# Patient Record
Sex: Male | Born: 1985 | Race: White | Hispanic: No | Marital: Single | Smoking: Current every day smoker
Health system: Southern US, Community
[De-identification: ages and names within clinical notes are randomized; demographics above are authoritative.]

## PROBLEM LIST (undated history)

## (undated) DIAGNOSIS — F1020 Alcohol dependence, uncomplicated: Secondary | ICD-10-CM

## (undated) DIAGNOSIS — F17200 Nicotine dependence, unspecified, uncomplicated: Secondary | ICD-10-CM

## (undated) DIAGNOSIS — R748 Abnormal levels of other serum enzymes: Secondary | ICD-10-CM

## (undated) DIAGNOSIS — F1210 Cannabis abuse, uncomplicated: Secondary | ICD-10-CM

## (undated) DIAGNOSIS — I10 Essential (primary) hypertension: Secondary | ICD-10-CM

## (undated) HISTORY — DX: Abnormal levels of other serum enzymes: R74.8

---

## 2011-10-29 ENCOUNTER — Ambulatory Visit: Payer: 59

## 2011-10-29 ENCOUNTER — Ambulatory Visit (INDEPENDENT_AMBULATORY_CARE_PROVIDER_SITE_OTHER): Payer: 59 | Admitting: Family Medicine

## 2011-10-29 VITALS — BP 122/82 | HR 76 | Temp 98.2°F | Resp 16 | Ht 71.0 in | Wt 194.0 lb

## 2011-10-29 DIAGNOSIS — R202 Paresthesia of skin: Secondary | ICD-10-CM

## 2011-10-29 DIAGNOSIS — M79643 Pain in unspecified hand: Secondary | ICD-10-CM

## 2011-10-29 DIAGNOSIS — M549 Dorsalgia, unspecified: Secondary | ICD-10-CM

## 2011-10-29 DIAGNOSIS — R200 Anesthesia of skin: Secondary | ICD-10-CM

## 2011-10-29 DIAGNOSIS — M79609 Pain in unspecified limb: Secondary | ICD-10-CM

## 2011-10-29 NOTE — Progress Notes (Signed)
  [facility]:  Office Visit  Chief Complaint:  Chief Complaint  Patient presents with  . Tailbone Pain    fell going down stairs- 2 days ago  . Hand Pain    Rt pinky pain- 2 days ago    HPI: Stephen Reid is a 26 y.o. male who complains of  2 day h/o back and right 5 th finger  And hand pain s/p fall. He fell on back and slid down stairs while trying to avoid a cat. He has numbness and tingling and pain in pinkey. Swelling. + back pain. ATried ASA wihtout relief. No pprior trauma or back problems. Works as Nutritional therapist, is right hand dominant.   History reviewed. No pertinent past medical history. History reviewed. No pertinent past surgical history. History   Social History  . Marital Status: Single    Spouse Name: N/A    Number of Children: N/A  . Years of Education: N/A   Social History Main Topics  . Smoking status: Current Everyday Smoker -- 0.2 packs/day  . Smokeless tobacco: None  . Alcohol Use: Yes  . Drug Use: No  . Sexually Active: None   Other Topics Concern  . None   Social History Narrative  . None   Family History  Problem Relation Age of Onset  . Diabetes Maternal Grandmother   . Diabetes Maternal Grandfather    No Known Allergies Prior to Admission medications   Not on File     ROS: The patient denies fevers, chills, night sweats, unintentional weight loss, chest pain, palpitations, wheezing, dyspnea on exertion, nausea, vomiting, abdominal pain, dysuria, hematuria, melena, weakness.   All other systems have been reviewed and were otherwise negative with the exception of those mentioned in the HPI and as above.    PHYSICAL EXAM: Filed Vitals:   10/29/11 1106  BP: 122/82  Pulse: 76  Temp: 98.2 F (36.8 C)  Resp: 16   Filed Vitals:   10/29/11 1106  Height: 5\' 11"  (1.803 m)  Weight: 194 lb (87.998 kg)   Body mass index is 27.06 kg/(m^2).  General: Alert, no acute distress HEENT:  Normocephalic, atraumatic, oropharynx  patent.  Cardiovascular:  Regular rate and rhythm, no rubs murmurs or gallops.  No Carotid bruits, radial pulse intact. No pedal edema.  Respiratory: Clear to auscultation bilaterally.  No wheezes, rales, or rhonchi.  No cyanosis, no use of accessory musculature GI: No organomegaly, abdomen is soft and non-tender, positive bowel sounds.  No masses. Skin: No rashes. Neurologic: Facial musculature symmetric. Psychiatric: Patient is appropriate throughout our interaction. Lymphatic: No cervical lymphadenopathy Musculoskeletal: Gait intact. Right 5 th finger-swelling at 5th DIP, tender, pain with ROM, good radial pulses. 4/5 strength, sensaation intact, +2 DTR Back-tender at sacrum and along spinous process of L1-L5   LABS: No results found for this or any previous visit.   EKG/XRAY:   Primary read interpreted by Dr. Conley Rolls at [facility]. Xrays negative for fx or dislocation. + soft swelling right 5th PIP   ASSESSMENT/PLAN: Encounter Diagnoses  Name Primary?  . Hand pain Yes  . Back pain   . Numbness and tingling     No fx/dislocation Buddy tape and/or splint prn NSAID prn pain Work note given, may return to work without restrictions on Monday   Hamilton Capri [place], DO 10/29/2011 2:27 PM

## 2013-04-24 IMAGING — CR DG HAND COMPLETE 3+V*R*
2 series · 2 of 2 positions shown · non-contrast
Comparison: None.

CLINICAL DATA: Fall down stairs.  Hand pain.

RIGHT HAND - COMPLETE 3+ VIEW

[PA]
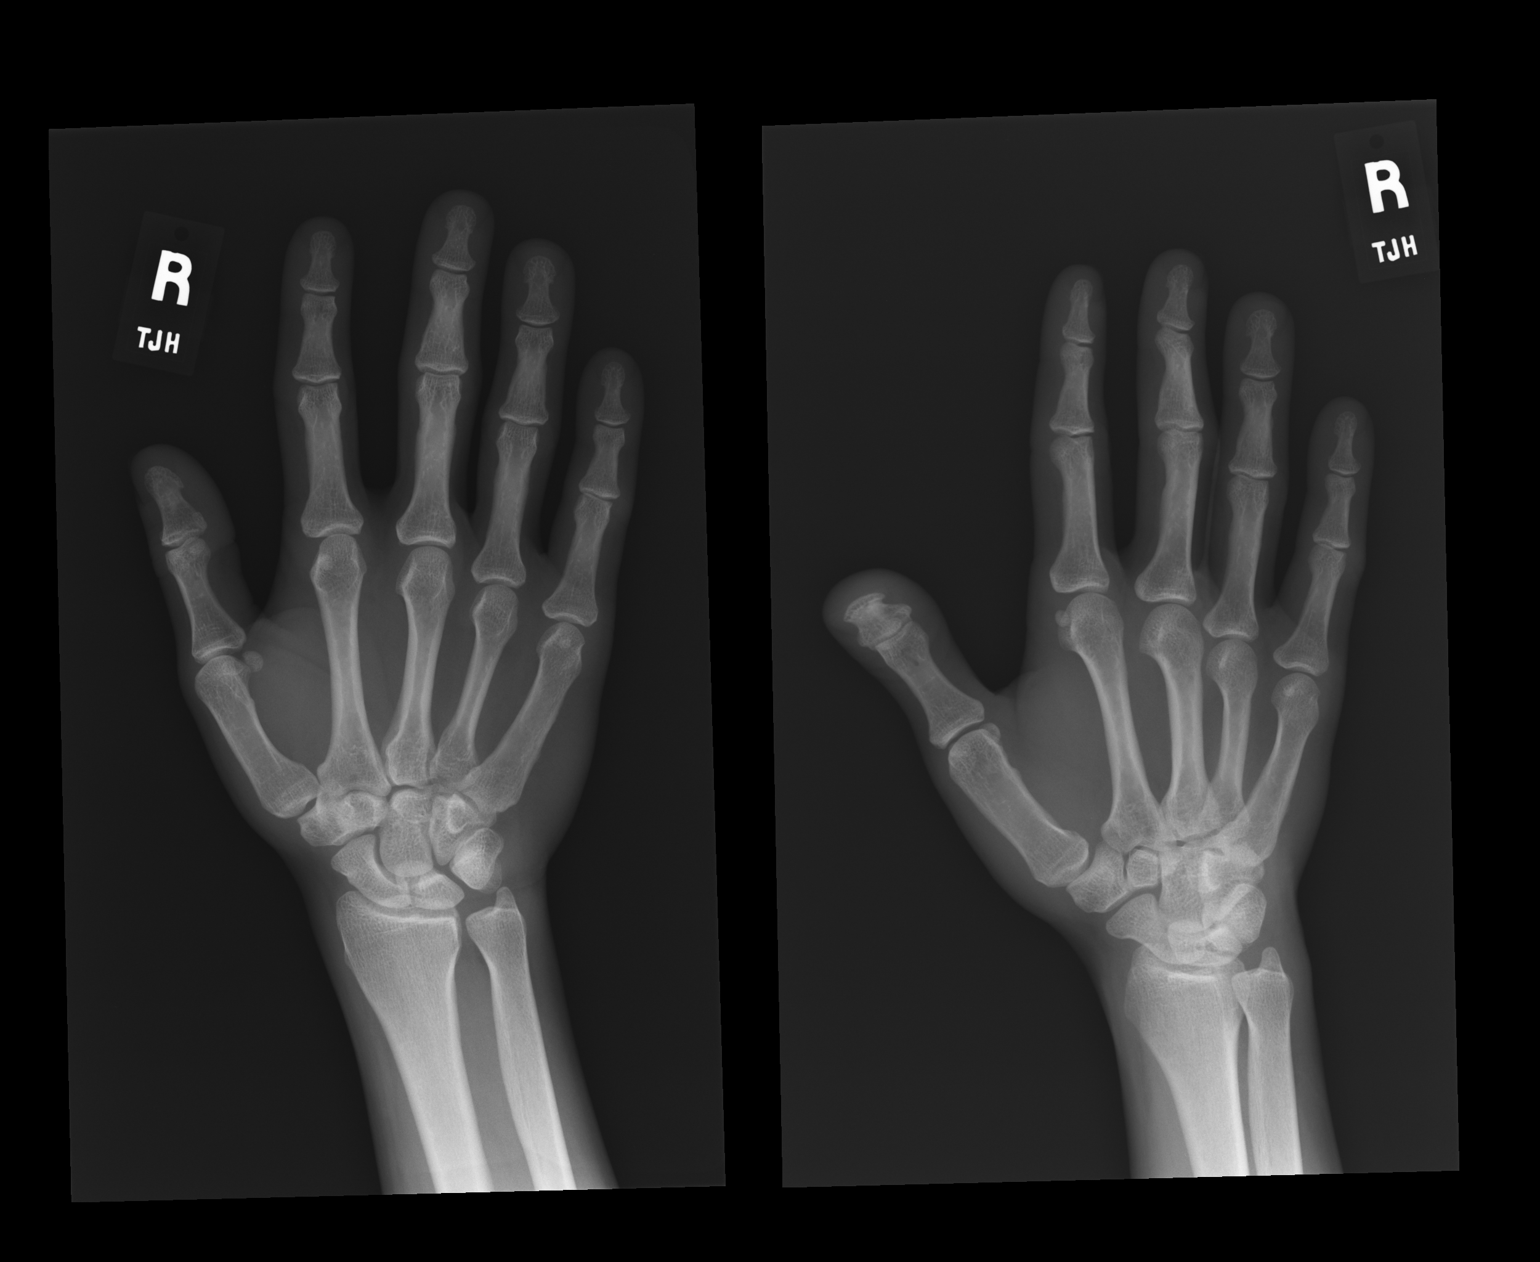

[lateral]
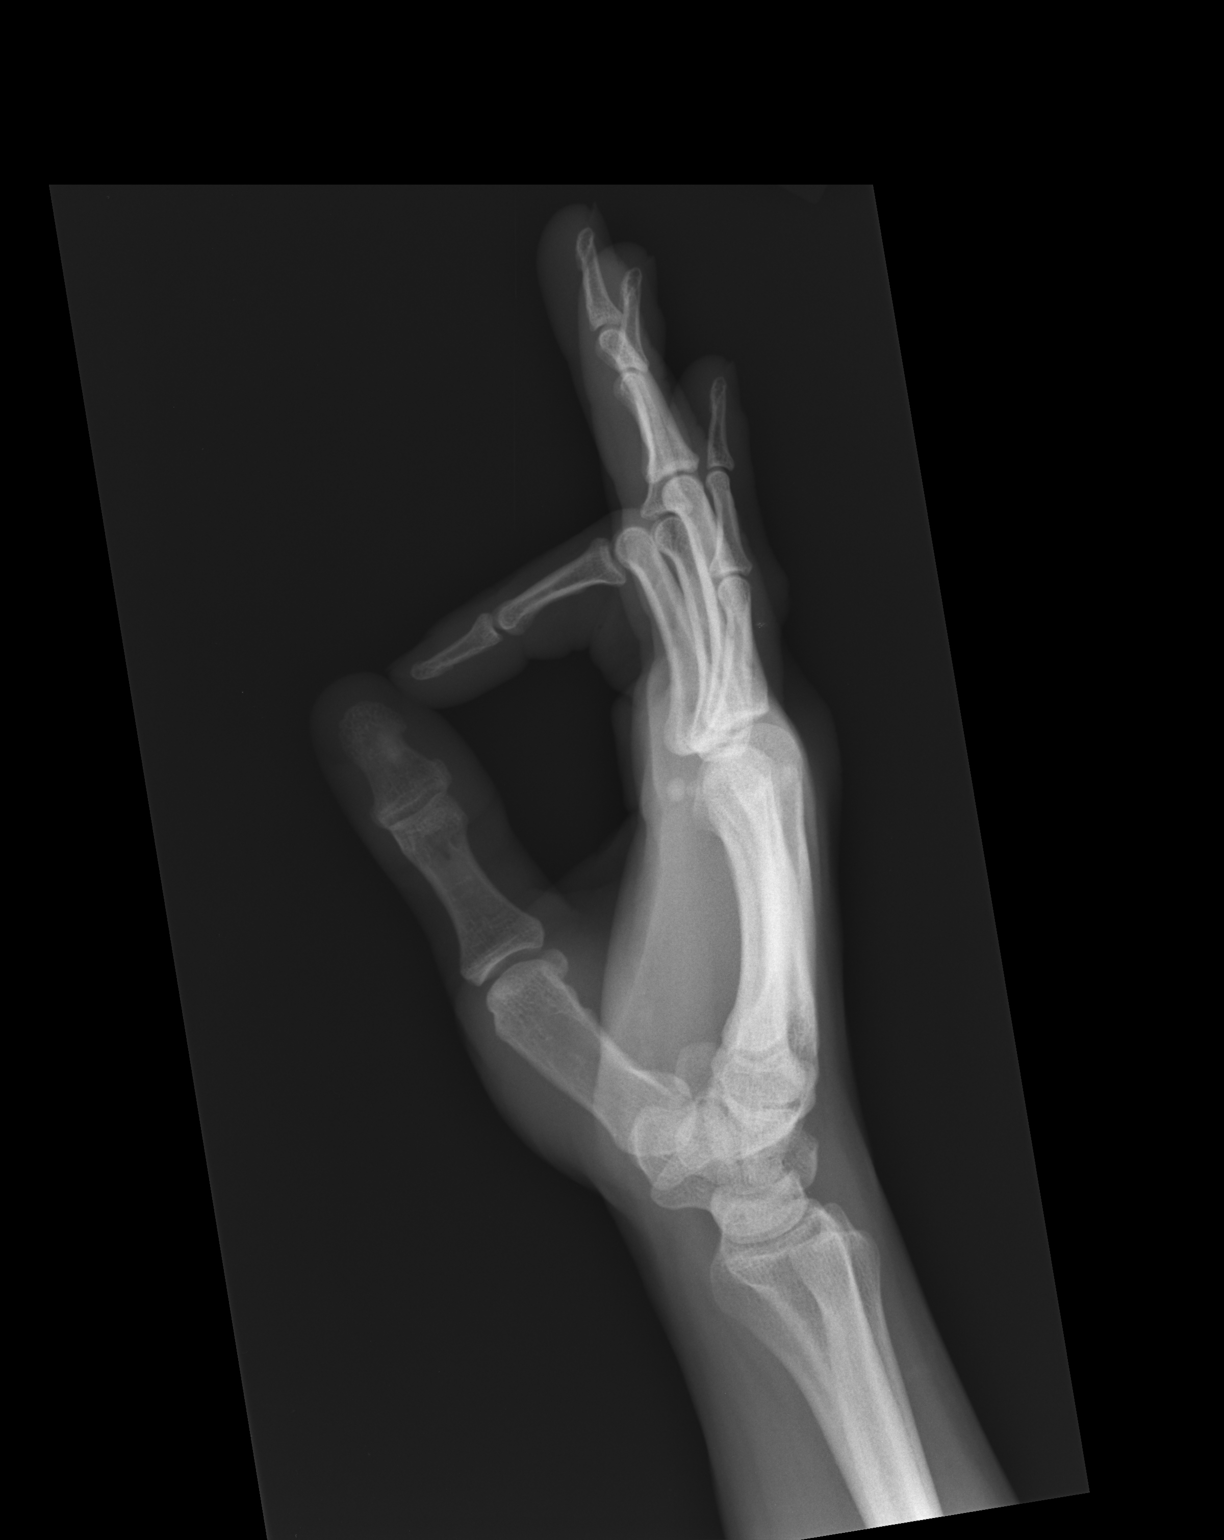

[2 of 2 positions shown; findings below may reference images not displayed]

FINDINGS: Anatomic alignment of the bones of the right hand.  There
is no fracture identified.  Soft tissues appear within normal
limits. Mild soft tissue swelling is present over the PIP joint of
the right small finger.
IMPRESSION: No acute osseous abnormality.

## 2013-04-24 IMAGING — CR DG LUMBAR SPINE COMPLETE 4+V
5 series · 5 of 5 positions shown · non-contrast
Comparison: None.

CLINICAL DATA: Fall.  Back pain.

LUMBAR SPINE - COMPLETE 4+ VIEW

[AP]
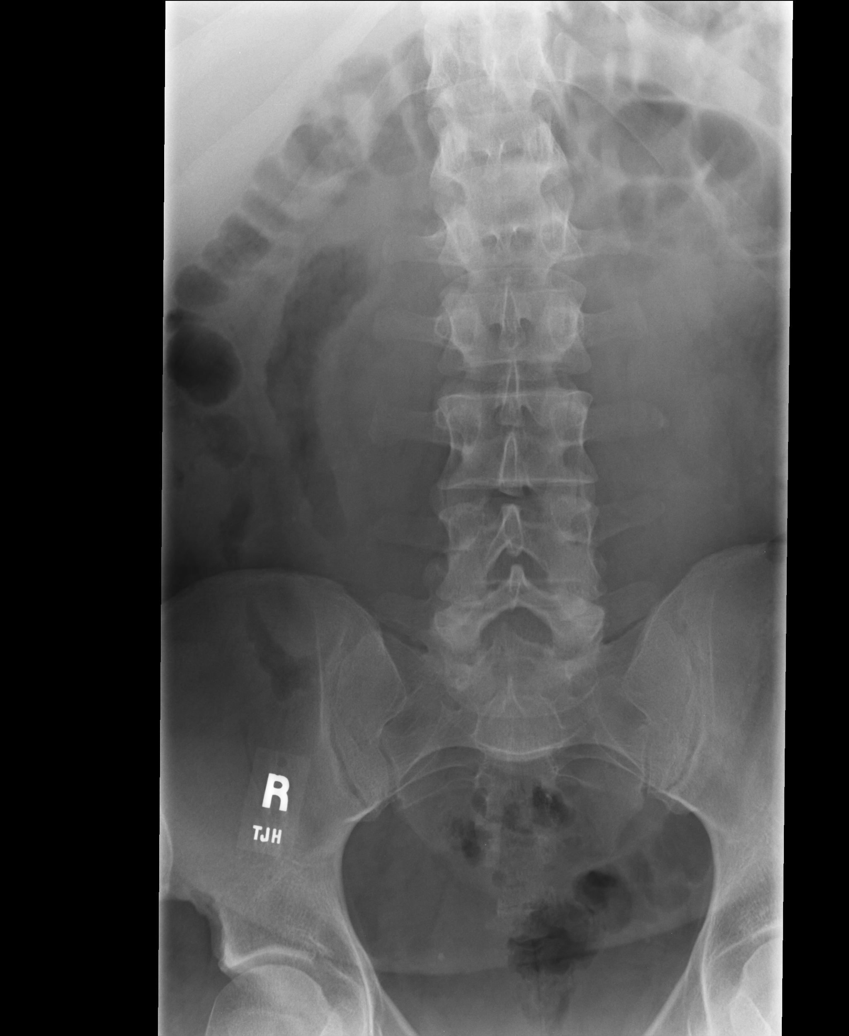

[rpo]
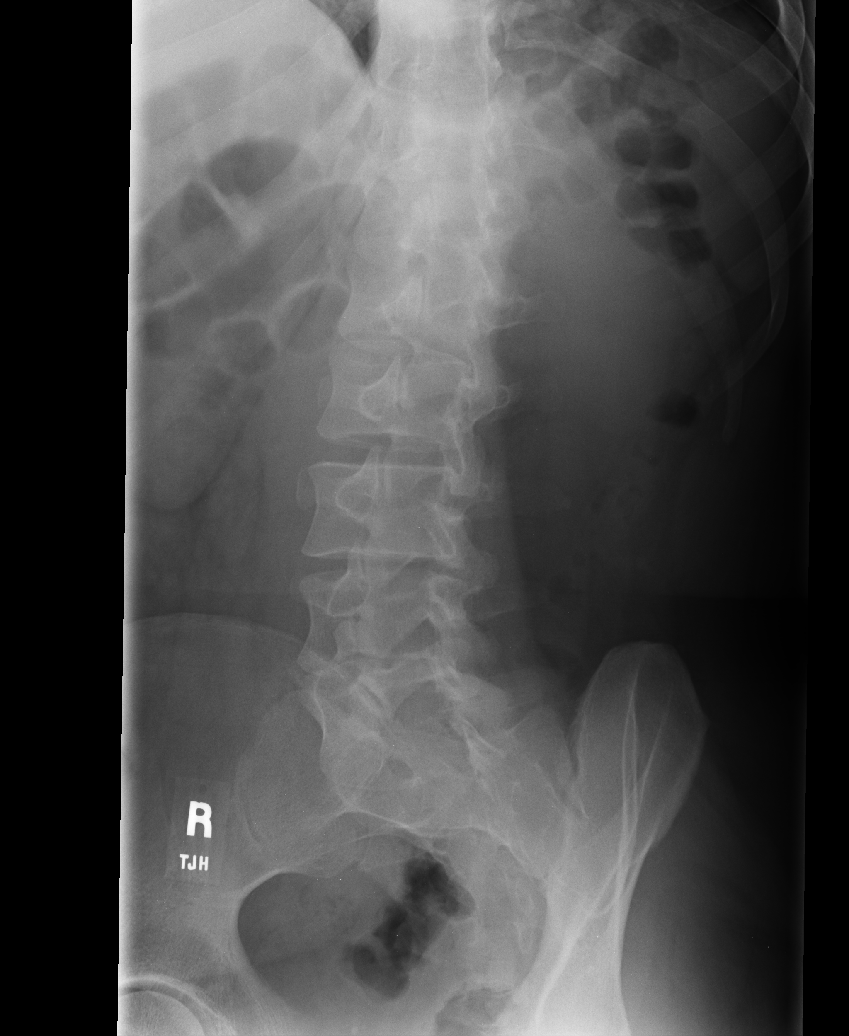

[lpo]
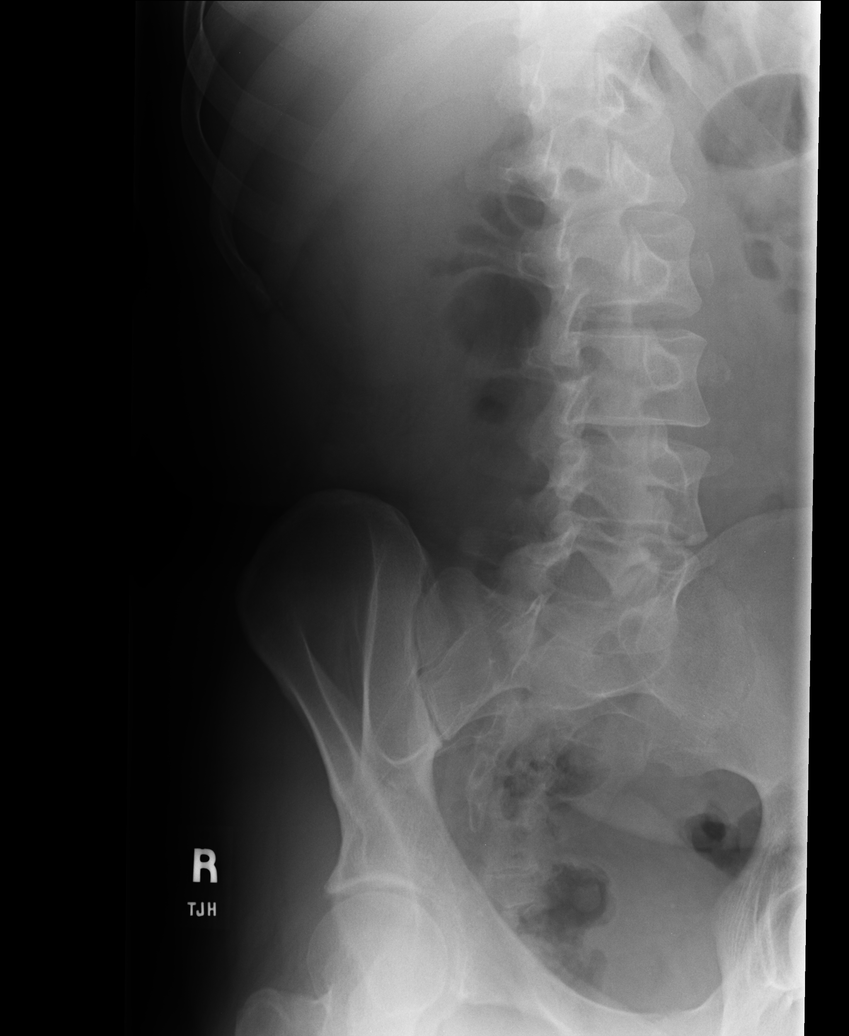

[lateral]
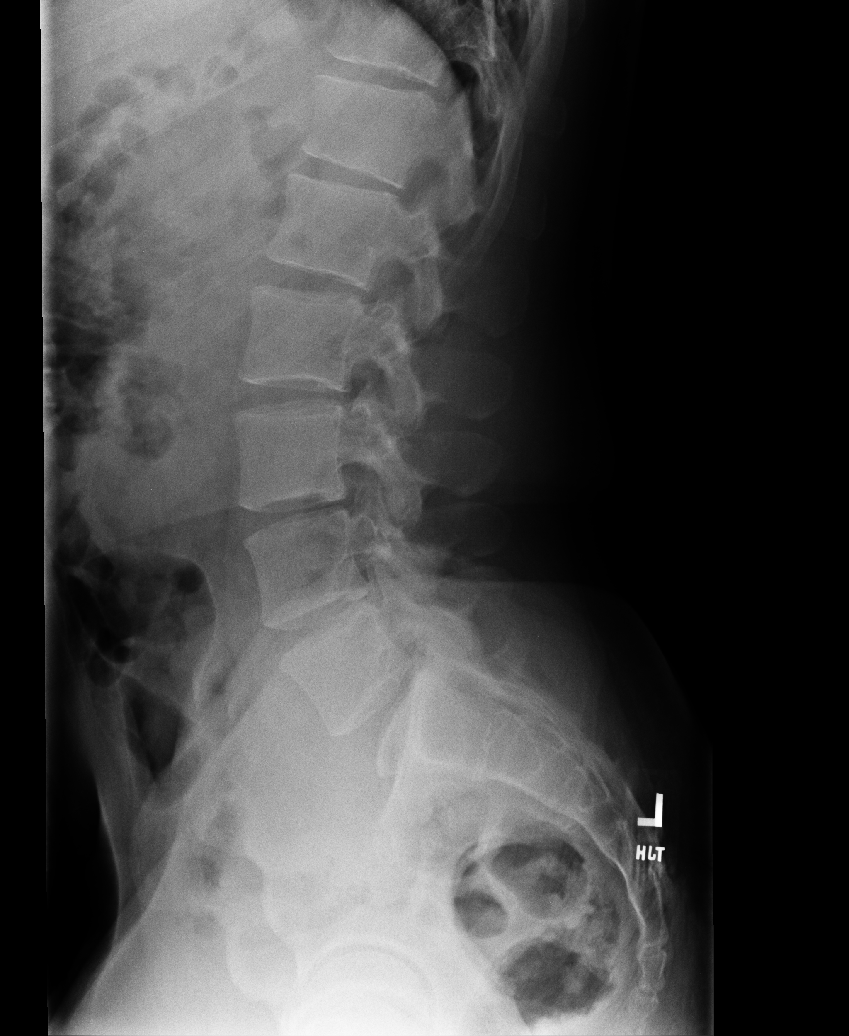

[l5 s1]
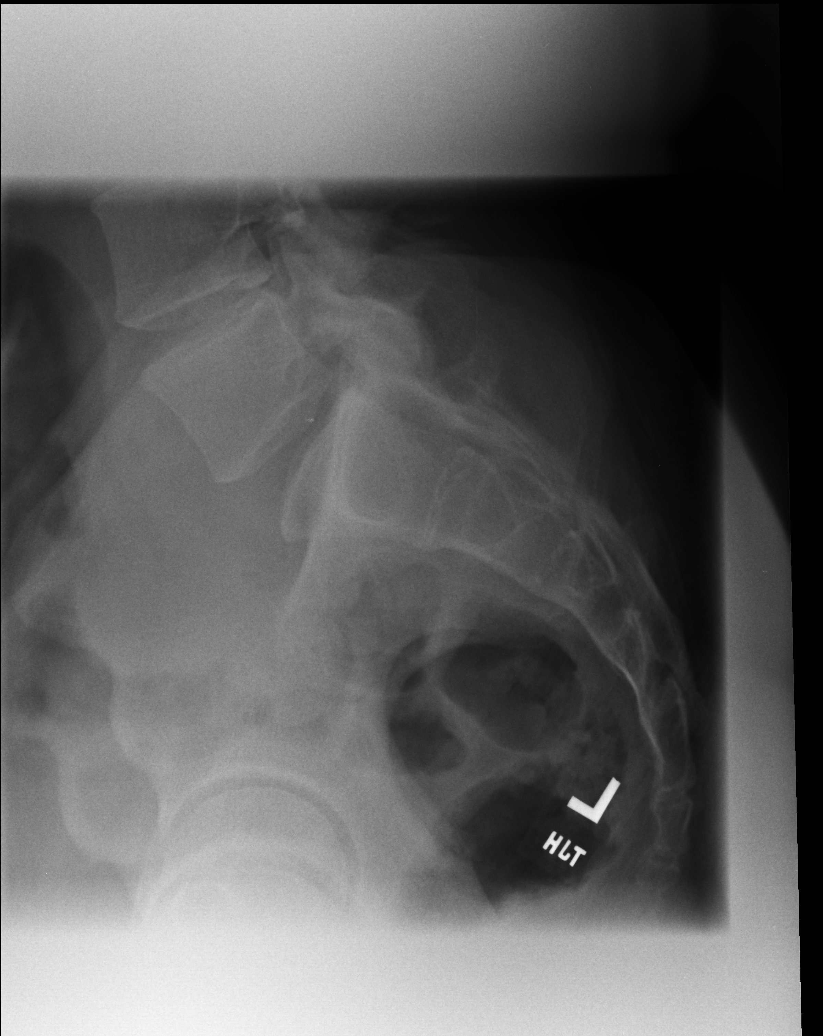

[5 of 5 positions shown; findings below may reference images not displayed]

FINDINGS: Mild levoconvex curvature with the apex at L2-L3.
Vertebral body height is preserved.  There are no pars defects
identified.  L4-L5 mild degenerative disc disease with narrowing of
the disc space.  Slightly exaggerated lumbar lordosis.  No
fracture.  Lumbosacral junction normal.
IMPRESSION: Mild L4-L5 degenerative disc disease.  No fracture or acute osseous
abnormality.  Mild levoconvex curvature may be positional or
secondary to spasm.

## 2013-04-24 IMAGING — CR DG THORACIC SPINE 2V
2 series · 2 of 2 positions shown · non-contrast
Comparison: None.

CLINICAL DATA: Fall.  Back pain.

THORACIC SPINE - 2 VIEW

[AP]
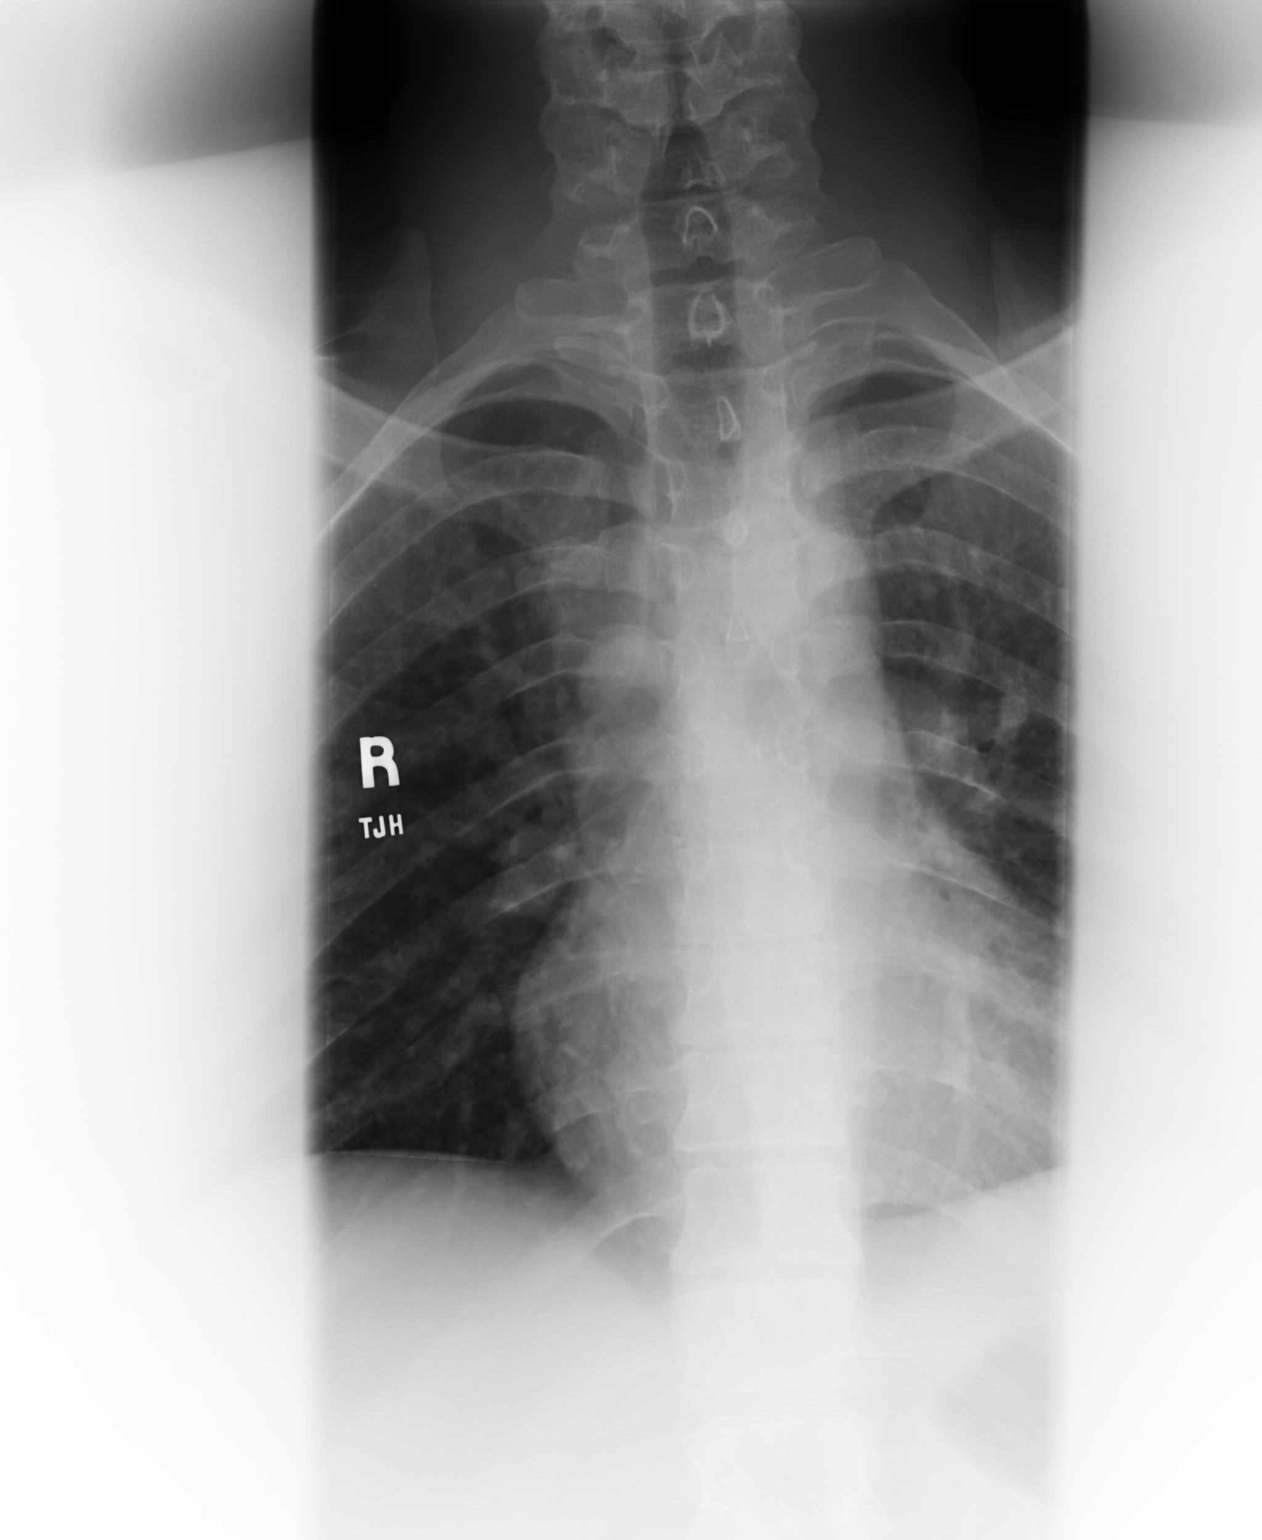

[lateral]
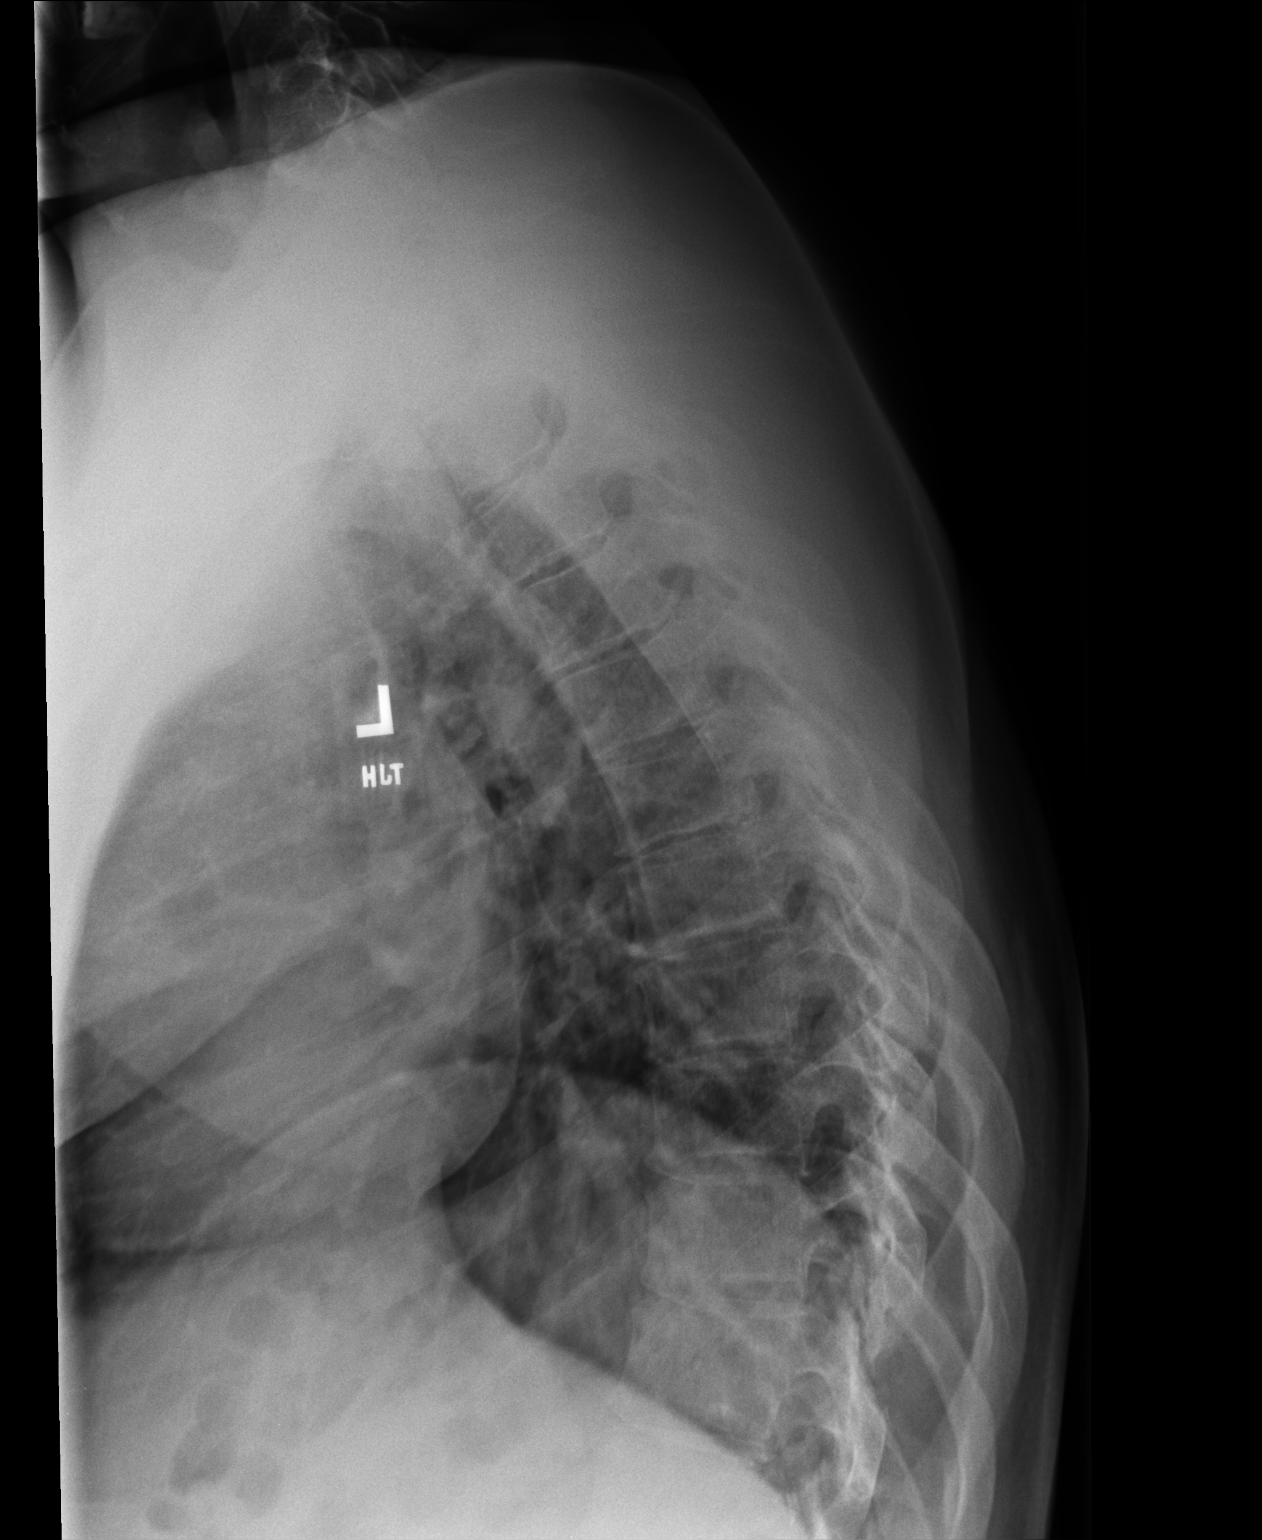

[2 of 2 positions shown; findings below may reference images not displayed]

FINDINGS: Anatomic alignment.  Vertebral body height is preserved.
Lower thoracic spine seen in conjunction with lumbar spine
radiographs same day.  Cervicothoracic junction not visualized.  No
swimmer's view is submitted.
IMPRESSION: No acute osseous abnormality.

## 2019-03-15 ENCOUNTER — Other Ambulatory Visit: Payer: Self-pay

## 2019-03-15 DIAGNOSIS — Z20822 Contact with and (suspected) exposure to covid-19: Secondary | ICD-10-CM

## 2019-03-17 LAB — NOVEL CORONAVIRUS, NAA: SARS-CoV-2, NAA: NOT DETECTED

## 2020-03-05 DIAGNOSIS — M79672 Pain in left foot: Secondary | ICD-10-CM | POA: Insufficient documentation

## 2020-03-05 DIAGNOSIS — G8929 Other chronic pain: Secondary | ICD-10-CM | POA: Insufficient documentation

## 2020-03-15 DIAGNOSIS — L03116 Cellulitis of left lower limb: Secondary | ICD-10-CM | POA: Insufficient documentation

## 2022-08-08 ENCOUNTER — Other Ambulatory Visit: Payer: Self-pay

## 2022-08-08 ENCOUNTER — Inpatient Hospital Stay (HOSPITAL_BASED_OUTPATIENT_CLINIC_OR_DEPARTMENT_OTHER)
Admission: EM | Admit: 2022-08-08 | Discharge: 2022-08-12 | DRG: 391 | Disposition: A | Discharge status: Home / Self Care | Payer: 59 | Attending: Internal Medicine | Admitting: Internal Medicine

## 2022-08-08 ENCOUNTER — Encounter (HOSPITAL_BASED_OUTPATIENT_CLINIC_OR_DEPARTMENT_OTHER): Payer: Self-pay

## 2022-08-08 ENCOUNTER — Emergency Department (HOSPITAL_BASED_OUTPATIENT_CLINIC_OR_DEPARTMENT_OTHER): Payer: 59

## 2022-08-08 DIAGNOSIS — F1020 Alcohol dependence, uncomplicated: Secondary | ICD-10-CM

## 2022-08-08 DIAGNOSIS — A419 Sepsis, unspecified organism: Principal | ICD-10-CM

## 2022-08-08 DIAGNOSIS — J690 Pneumonitis due to inhalation of food and vomit: Secondary | ICD-10-CM | POA: Diagnosis present

## 2022-08-08 DIAGNOSIS — E860 Dehydration: Secondary | ICD-10-CM | POA: Diagnosis present

## 2022-08-08 DIAGNOSIS — I10 Essential (primary) hypertension: Secondary | ICD-10-CM | POA: Diagnosis present

## 2022-08-08 DIAGNOSIS — R109 Unspecified abdominal pain: Secondary | ICD-10-CM | POA: Diagnosis not present

## 2022-08-08 DIAGNOSIS — Z1152 Encounter for screening for COVID-19: Secondary | ICD-10-CM

## 2022-08-08 DIAGNOSIS — F1210 Cannabis abuse, uncomplicated: Secondary | ICD-10-CM

## 2022-08-08 DIAGNOSIS — Z79899 Other long term (current) drug therapy: Secondary | ICD-10-CM

## 2022-08-08 DIAGNOSIS — K760 Fatty (change of) liver, not elsewhere classified: Secondary | ICD-10-CM | POA: Diagnosis present

## 2022-08-08 DIAGNOSIS — E876 Hypokalemia: Secondary | ICD-10-CM | POA: Diagnosis present

## 2022-08-08 DIAGNOSIS — Z811 Family history of alcohol abuse and dependence: Secondary | ICD-10-CM

## 2022-08-08 DIAGNOSIS — F17210 Nicotine dependence, cigarettes, uncomplicated: Secondary | ICD-10-CM | POA: Diagnosis present

## 2022-08-08 DIAGNOSIS — E872 Acidosis, unspecified: Secondary | ICD-10-CM | POA: Diagnosis present

## 2022-08-08 DIAGNOSIS — K7010 Alcoholic hepatitis without ascites: Secondary | ICD-10-CM | POA: Diagnosis present

## 2022-08-08 DIAGNOSIS — F17200 Nicotine dependence, unspecified, uncomplicated: Secondary | ICD-10-CM

## 2022-08-08 DIAGNOSIS — K2920 Alcoholic gastritis without bleeding: Principal | ICD-10-CM | POA: Diagnosis present

## 2022-08-08 DIAGNOSIS — F10239 Alcohol dependence with withdrawal, unspecified: Secondary | ICD-10-CM | POA: Diagnosis present

## 2022-08-08 DIAGNOSIS — Z833 Family history of diabetes mellitus: Secondary | ICD-10-CM

## 2022-08-08 HISTORY — DX: Nicotine dependence, unspecified, uncomplicated: F17.200

## 2022-08-08 HISTORY — DX: Cannabis abuse, uncomplicated: F12.10

## 2022-08-08 HISTORY — DX: Alcohol dependence, uncomplicated: F10.20

## 2022-08-08 LAB — CBC
HCT: 51.5 % (ref 39.0–52.0)
Hemoglobin: 18 g/dL — ABNORMAL HIGH (ref 13.0–17.0)
MCH: 34.3 pg — ABNORMAL HIGH (ref 26.0–34.0)
MCHC: 35 g/dL (ref 30.0–36.0)
MCV: 98.1 fL (ref 80.0–100.0)
Platelets: 226 10*3/uL (ref 150–400)
RBC: 5.25 MIL/uL (ref 4.22–5.81)
RDW: 12.7 % (ref 11.5–15.5)
WBC: 21.7 10*3/uL — ABNORMAL HIGH (ref 4.0–10.5)
nRBC: 0 % (ref 0.0–0.2)

## 2022-08-08 LAB — RESP PANEL BY RT-PCR (RSV, FLU A&B, COVID)  RVPGX2
Influenza A by PCR: NEGATIVE
Influenza B by PCR: NEGATIVE
Resp Syncytial Virus by PCR: NEGATIVE
SARS Coronavirus 2 by RT PCR: NEGATIVE

## 2022-08-08 LAB — URINALYSIS, ROUTINE W REFLEX MICROSCOPIC
Bacteria, UA: NONE SEEN
Bilirubin Urine: NEGATIVE
Glucose, UA: NEGATIVE mg/dL
Hgb urine dipstick: NEGATIVE
Ketones, ur: NEGATIVE mg/dL
Leukocytes,Ua: NEGATIVE
Nitrite: NEGATIVE
Specific Gravity, Urine: >1.046 — ABNORMAL HIGH (ref 1.005–1.030)
pH: 6.5 (ref 5.0–8.0)

## 2022-08-08 LAB — COMPREHENSIVE METABOLIC PANEL
ALT: 88 U/L — ABNORMAL HIGH (ref 0–44)
AST: 106 U/L — ABNORMAL HIGH (ref 15–41)
Albumin: 4.2 g/dL (ref 3.5–5.0)
Alkaline Phosphatase: 99 U/L (ref 38–126)
Anion gap: 15 (ref 5–15)
BUN: 8 mg/dL (ref 6–20)
CO2: 23 mmol/L (ref 22–32)
Calcium: 9.5 mg/dL (ref 8.9–10.3)
Chloride: 102 mmol/L (ref 98–111)
Creatinine, Ser: 0.83 mg/dL (ref 0.61–1.24)
GFR, Estimated: >60 mL/min (ref >60)
Glucose, Bld: 123 mg/dL — ABNORMAL HIGH (ref 70–99)
Potassium: 3.9 mmol/L (ref 3.5–5.1)
Sodium: 140 mmol/L (ref 135–145)
Total Bilirubin: 0.9 mg/dL (ref 0.3–1.2)
Total Protein: 7.9 g/dL (ref 6.5–8.1)

## 2022-08-08 LAB — LACTIC ACID, PLASMA
Lactic Acid, Venous: 2.5 mmol/L (ref 0.5–1.9)
Lactic Acid, Venous: 1.3 mmol/L (ref 0.5–1.9)

## 2022-08-08 LAB — LIPASE, BLOOD: Lipase: 30 U/L (ref 11–51)

## 2022-08-08 LAB — TROPONIN I (HIGH SENSITIVITY): Troponin I (High Sensitivity): 4 ng/L (ref <18)

## 2022-08-08 MED ORDER — SODIUM CHLORIDE 0.9 % IV BOLUS (SEPSIS)
1000.0000 mL | Freq: Once | INTRAVENOUS | Status: AC
  Administered 2022-08-08: 1000 mL via INTRAVENOUS

## 2022-08-08 MED ORDER — VANCOMYCIN HCL IN DEXTROSE 1-5 GM/200ML-% IV SOLN
1000.0000 mg | Freq: Once | INTRAVENOUS | Status: AC
  Administered 2022-08-08: 1000 mg via INTRAVENOUS
  Filled 2022-08-08: qty 200

## 2022-08-08 MED ORDER — HYDROMORPHONE HCL 1 MG/ML IJ SOLN
1.0000 mg | Freq: Once | INTRAMUSCULAR | Status: AC
  Administered 2022-08-08: 1 mg via INTRAVENOUS
  Filled 2022-08-08: qty 1

## 2022-08-08 MED ORDER — THIAMINE HCL 100 MG/ML IJ SOLN
100.0000 mg | Freq: Every day | INTRAMUSCULAR | Status: DC
  Administered 2022-08-10: 100 mg via INTRAVENOUS
  Filled 2022-08-08 (×2): qty 2

## 2022-08-08 MED ORDER — DIPHENHYDRAMINE HCL 50 MG/ML IJ SOLN
25.0000 mg | Freq: Once | INTRAMUSCULAR | Status: AC
  Administered 2022-08-08: 25 mg via INTRAVENOUS
  Filled 2022-08-08: qty 1

## 2022-08-08 MED ORDER — LORAZEPAM 2 MG/ML IJ SOLN
0.0000 mg | Freq: Four times a day (QID) | INTRAMUSCULAR | Status: DC
  Administered 2022-08-08 – 2022-08-09 (×2): 2 mg via INTRAVENOUS
  Filled 2022-08-08 (×2): qty 1

## 2022-08-08 MED ORDER — LORAZEPAM 2 MG/ML IJ SOLN
2.0000 mg | Freq: Once | INTRAMUSCULAR | Status: AC
  Administered 2022-08-08: 2 mg via INTRAVENOUS
  Filled 2022-08-08: qty 1

## 2022-08-08 MED ORDER — LACTATED RINGERS IV SOLN: INTRAVENOUS | Status: DC

## 2022-08-08 MED ORDER — SODIUM CHLORIDE 0.9 % IV SOLN
2.0000 g | Freq: Once | INTRAVENOUS | Status: AC
  Administered 2022-08-08: 2 g via INTRAVENOUS
  Filled 2022-08-08: qty 12.5

## 2022-08-08 MED ORDER — CEFEPIME 1 G IM INJECTION: 1.0000 g | Freq: Once | INTRAMUSCULAR | Status: DC

## 2022-08-08 MED ORDER — SODIUM CHLORIDE 0.9 % IV BOLUS (SEPSIS): 1000.0000 mL | Freq: Once | INTRAVENOUS | Status: DC

## 2022-08-08 MED ORDER — SODIUM CHLORIDE 0.9 % IV SOLN: 1000.0000 mL | INTRAVENOUS | Status: DC

## 2022-08-08 MED ORDER — THIAMINE MONONITRATE 100 MG PO TABS
100.0000 mg | ORAL_TABLET | Freq: Every day | ORAL | Status: DC
  Administered 2022-08-09 – 2022-08-12 (×3): 100 mg via ORAL
  Filled 2022-08-08 (×4): qty 1

## 2022-08-08 MED ORDER — IOHEXOL 350 MG/ML SOLN
100.0000 mL | Freq: Once | INTRAVENOUS | Status: AC | PRN
  Administered 2022-08-08: 100 mL via INTRAVENOUS

## 2022-08-08 MED ORDER — HYDROMORPHONE HCL 1 MG/ML IJ SOLN
1.0000 mg | INTRAMUSCULAR | Status: DC | PRN
  Administered 2022-08-08 – 2022-08-09 (×2): 1 mg via INTRAVENOUS
  Filled 2022-08-08 (×2): qty 1

## 2022-08-08 MED ORDER — ONDANSETRON HCL 4 MG/2ML IJ SOLN
4.0000 mg | Freq: Once | INTRAMUSCULAR | Status: AC
  Administered 2022-08-08: 4 mg via INTRAVENOUS
  Filled 2022-08-08: qty 2

## 2022-08-08 MED ORDER — PANTOPRAZOLE SODIUM 40 MG IV SOLR
40.0000 mg | Freq: Once | INTRAVENOUS | Status: AC
  Administered 2022-08-08: 40 mg via INTRAVENOUS
  Filled 2022-08-08: qty 10

## 2022-08-08 MED ORDER — LORAZEPAM 2 MG/ML IJ SOLN: 0.0000 mg | Freq: Two times a day (BID) | INTRAMUSCULAR | Status: DC

## 2022-08-08 MED ORDER — LORAZEPAM 1 MG PO TABS: 0.0000 mg | ORAL_TABLET | Freq: Two times a day (BID) | ORAL | Status: DC

## 2022-08-08 MED ORDER — LORAZEPAM 1 MG PO TABS: 0.0000 mg | ORAL_TABLET | Freq: Four times a day (QID) | ORAL | Status: DC

## 2022-08-08 MED ORDER — HALOPERIDOL LACTATE 5 MG/ML IJ SOLN
5.0000 mg | Freq: Once | INTRAMUSCULAR | Status: AC
  Administered 2022-08-08: 5 mg via INTRAVENOUS
  Filled 2022-08-08: qty 1

## 2022-08-08 NOTE — ED Notes (Signed)
Patient transported to CT 

## 2022-08-08 NOTE — ED Notes (Signed)
CRITICAL VALUE STICKER  CRITICAL VALUE:Lactate 2.5  RECEIVER (on-site recipient of call):Shawnie Pons, RN  DATE & TIME NOTIFIED:   MESSENGER (representative from lab):  MD NOTIFIED: Dr. Oswald Hillock  TIME OF NOTIFICATION:1817  RESPONSE:

## 2022-08-08 NOTE — ED Notes (Signed)
Blood culture x 1 drawn from left ac and then antibiotics started.

## 2022-08-08 NOTE — Sepsis Progress Note (Signed)
Sepsis protocol is being followed by [facility]. 

## 2022-08-08 NOTE — ED Provider Notes (Incomplete)
[facility] EMERGENCY DEPT Provider Note   CSN: [id number] Arrival date & time: 08/08/22  1341     History Chief Complaint  Patient presents with   Abdominal Pain    HPI Stephen Reid is a 37 y.o. male presenting for ***.   Patient's recorded medical, surgical, social, medication list and allergies were reviewed in the Snapshot window as part of the initial history.   Review of Systems   Review of Systems  Physical Exam Updated Vital Signs BP (!) 187/130   Pulse (!) 124   Temp 97.8 F (36.6 C)   Resp (!) 23   Ht 5\' 11"  (1.803 m)   Wt 88 kg   SpO2 100%   BMI 27.06 kg/m  Physical Exam   ED Course/ Medical Decision Making/ A&P Clinical Course as of 08/08/22 1738  Sat Aug 08, 2022  1726 I was called emergently to patient's bedside for unstable vital signs.  Patient endorses a history of 8-10 drinks per day.  Endorses severe right upper quadrant pain that developed throughout the day today. [CC]  1727 Appears to have sepsis uncertain etiology with white count 21, tachycardia.  Appears diaphoretic. [CC]    Clinical Course User Index [CC] Tretha Sciara, MD    Procedures Procedures   Medications Ordered in ED Medications  sodium chloride 0.9 % bolus 1,000 mL (1,000 mLs Intravenous New Bag/Given 08/08/22 1738)    Followed by  sodium chloride 0.9 % bolus 1,000 mL (has no administration in time range)    Followed by  sodium chloride 0.9 % bolus 1,000 mL (1,000 mLs Intravenous New Bag/Given 08/08/22 1738)    Followed by  0.9 %  sodium chloride infusion (has no administration in time range)  vancomycin (VANCOCIN) IVPB 1000 mg/200 mL premix (has no administration in time range)  lactated ringers infusion (has no administration in time range)  ceFEPIme (MAXIPIME) 2 g in sodium chloride 0.9 % 100 mL IVPB (has no administration in time range)  LORazepam (ATIVAN) injection 2 mg (2 mg Intravenous Given 08/08/22 1731)  ondansetron (ZOFRAN) injection 4 mg  (4 mg Intravenous Given 08/08/22 1732)  HYDROmorphone (DILAUDID) injection 1 mg (1 mg Intravenous Given 08/08/22 1737)    Medical Decision Making:    Stephen Reid is a 37 y.o. male who presented to the ED today with *** detailed above.     {crccomplexity:27900}  Complete initial physical exam performed, notably the patient  was ***.      Reviewed and confirmed nursing documentation for past medical history, family history, social history.    Initial Assessment:   With the patient's presentation of ***, most likely diagnosis is ***. Other diagnoses were considered including (but not limited to) ***. These are considered less likely due to history of present illness and physical exam findings.   {crccopa:27899}  Initial Plan:  ***  ***Screening labs including CBC and Metabolic panel to evaluate for infectious or metabolic etiology of disease.  ***Urinalysis with reflex culture ordered to evaluate for UTI or relevant urologic/nephrologic pathology.  ***CXR to evaluate for structural/infectious intrathoracic pathology.  ***EKG to evaluate for cardiac pathology. Objective evaluation as below reviewed with plan for close reassessment  Initial Study Results:   Laboratory  All laboratory results reviewed without evidence of clinically relevant pathology.   ***Exceptions include: ***   ***EKG EKG was reviewed independently. Rate, rhythm, axis, intervals all examined and without medically relevant abnormality. ST segments without concerns for elevations.    Radiology  All  images reviewed independently. ***Agree with radiology report at this time.   No results found.   Consults:  Case discussed with ***.   Final Assessment and Plan:   ***   ***  Clinical Impression: No diagnosis found.   Data Unavailable   Final Clinical Impression(s) / ED Diagnoses Final diagnoses:  None    Rx / DC Orders ED Discharge Orders     None

## 2022-08-08 NOTE — ED Provider Notes (Incomplete)
[facility] EMERGENCY DEPT Provider Note   CSN: [id number] Arrival date & time: 08/08/22  1341     History {Add pertinent medical, surgical, social history, OB history to HPI:1} Chief Complaint  Patient presents with  . Abdominal Pain    Stephen Reid is a 37 y.o. male.  Pt complains of severe abdominal pain and vomiting.    The history is provided by the patient and the spouse. No language interpreter was used.  Abdominal Pain Pain location:  Generalized Pain quality: sharp and stabbing   Pain radiates to:  LLQ, LUQ, RLQ and RUQ Pain severity:  Severe Onset quality:  Sudden Duration:  1 day Timing:  Constant Progression:  Worsening Chronicity:  New Context: alcohol use   Context: not eating, not medication withdrawal, not sick contacts, not suspicious food intake and not trauma   Relieved by:  Nothing Worsened by:  Nothing Ineffective treatments:  None tried Associated symptoms: anorexia, fever and nausea   Risk factors: alcohol abuse    Patient reports he normally drinks approximately 8 alcoholic beverages a day.  Patient does smoke marijuana he denies any other drug use    Home Medications Prior to Admission medications   Not on File      Allergies    Patient has no known allergies.    Review of Systems   Review of Systems  Constitutional:  Positive for diaphoresis and fever.  Gastrointestinal:  Positive for abdominal pain, anorexia and nausea.  All other systems reviewed and are negative.   Physical Exam Updated Vital Signs BP (!) 176/133   Pulse (!) 124   Temp 99 F (37.2 C) (Oral)   Resp 18   Ht 5\' 11"  (1.803 m)   Wt 88 kg   SpO2 93%   BMI 27.06 kg/m  Physical Exam Vitals and nursing note reviewed.  Constitutional:      Appearance: He is ill-appearing and diaphoretic.  HENT:     Head: Normocephalic.     Mouth/Throat:     Mouth: Mucous membranes are moist.  Cardiovascular:     Rate and Rhythm: Tachycardia present.   Pulmonary:     Effort: Pulmonary effort is normal.     Breath sounds: Normal breath sounds.  Abdominal:     General: Bowel sounds are normal. There is no distension.     Tenderness: There is generalized abdominal tenderness. There is guarding.     Hernia: There is no hernia in the umbilical area.  Musculoskeletal:        General: Normal range of motion.     Cervical back: Normal range of motion.  Skin:    General: Skin is warm.  Neurological:     General: No focal deficit present.     Mental Status: He is alert and oriented to person, place, and time.  Psychiatric:        Mood and Affect: Mood is anxious.     ED Results / Procedures / Treatments   Labs (all labs ordered are listed, but only abnormal results are displayed) Labs Reviewed  COMPREHENSIVE METABOLIC PANEL - Abnormal; Notable for the following components:      Result Value   Glucose, Bld 123 (*)    AST 106 (*)    ALT 88 (*)    All other components within normal limits  CBC - Abnormal; Notable for the following components:   WBC 21.7 (*)    Hemoglobin 18.0 (*)    MCH 34.3 (*)  All other components within normal limits  LACTIC ACID, PLASMA - Abnormal; Notable for the following components:   Lactic Acid, Venous 2.5 (*)    All other components within normal limits  CULTURE, BLOOD (SINGLE)  RESP PANEL BY RT-PCR (RSV, FLU A&B, COVID)  RVPGX2  RESPIRATORY PANEL BY PCR  LIPASE, BLOOD  LACTIC ACID, PLASMA  URINALYSIS, ROUTINE W REFLEX MICROSCOPIC  TROPONIN I (HIGH SENSITIVITY)  TROPONIN I (HIGH SENSITIVITY)    EKG None  Radiology US Abdomen Limited RUQ (LIVER/GB)  Result Date: 08/08/2022 CLINICAL DATA:  151471 RUQ pain 151471 EXAM: ULTRASOUND ABDOMEN LIMITED RIGHT UPPER QUADRANT COMPARISON:  None Available. FINDINGS: Gallbladder: No gallstones, pericholecystic fluid or wall thickening visualized. Common bile duct: Not visualized due to bowel gas. Liver: Liver is hyperechoic consistent with fatty  infiltration. No focal hepatic lesions identified. No intrahepatic ductal dilatation. IMPRESSION: Hepatic fatty infiltration. The evaluation was limited by suboptimal visualization. CBD could not be seen. Electronically Signed   By: Layla Maw M.D.   On: 08/08/2022 20:04   CT Angio Chest/Abd/Pel for Dissection W and/or Wo Contrast  Result Date: 08/08/2022 CLINICAL DATA:  Aortic aneurysm suspected abdominal pain EXAM: CT ANGIOGRAPHY CHEST, ABDOMEN AND PELVIS TECHNIQUE: Non-contrast CT of the chest was initially obtained. Multidetector CT imaging through the chest, abdomen and pelvis was performed using the standard protocol during bolus administration of intravenous contrast. Multiplanar reconstructed images and MIPs were obtained and reviewed to evaluate the vascular anatomy. RADIATION DOSE REDUCTION: This exam was performed according to the departmental dose-optimization program which includes automated exposure control, adjustment of the mA and/or kV according to patient size and/or use of iterative reconstruction technique. CONTRAST:  OMNIPAQUE IOHEXOL 350 MG/ML SOLN COMPARISON:  None Available. FINDINGS: CTA CHEST FINDINGS Cardiovascular: Heart is normal size. Aorta is normal caliber. No evidence of aortic dissection. No visible pulmonary embolus. Mediastinum/Nodes: No mediastinal, hilar, or axillary adenopathy. Trachea and esophagus are unremarkable. Thyroid unremarkable. Small hiatal hernia. Lungs/Pleura: Lungs are clear. No focal airspace opacities or suspicious nodules. No effusions. Musculoskeletal: Chest wall soft tissues are unremarkable. No acute bony abnormality. Review of the MIP images confirms the above findings. CTA ABDOMEN AND PELVIS FINDINGS VASCULAR Aorta: Normal caliber aorta without aneurysm, dissection, vasculitis or significant stenosis. Celiac: Patent without evidence of aneurysm, dissection, vasculitis or significant stenosis. SMA: Patent without evidence of aneurysm,  dissection, vasculitis or significant stenosis. Renals: Both renal arteries are patent without evidence of aneurysm, dissection, vasculitis, fibromuscular dysplasia or significant stenosis. IMA: Patent without evidence of aneurysm, dissection, vasculitis or significant stenosis. Inflow: Patent without evidence of aneurysm, dissection, vasculitis or significant stenosis. Veins: No obvious venous abnormality within the limitations of this arterial phase study. Review of the MIP images confirms the above findings. NON-VASCULAR Hepatobiliary: Diffuse low-density throughout the liver compatible with fatty infiltration. No focal abnormality. Gallbladder unremarkable. Pancreas: No focal abnormality or ductal dilatation. Spleen: No focal abnormality.  Normal size. Adrenals/Urinary Tract: No adrenal abnormality. No focal renal abnormality. No stones or hydronephrosis. Urinary bladder is unremarkable. Stomach/Bowel: Normal appendix. Stomach, large and small bowel grossly unremarkable. Lymphatic: No adenopathy Reproductive: No visible focal abnormality. Other: No free fluid or free air. Musculoskeletal: No acute bony abnormality. Review of the MIP images confirms the above findings. IMPRESSION: No evidence of aortic aneurysm or dissection. No evidence of pulmonary embolus. No acute findings in the chest, abdomen or pelvis. Small hiatal hernia. Hepatic steatosis. Electronically Signed   By: Charlett Nose M.D.   On: 08/08/2022 18:16  Procedures .Critical Care  Performed by: Fransico Meadow, PA-C Authorized by: Fransico Meadow, PA-C   Critical care provider statement:    Critical care time (minutes):  45   Critical care start time:  08/08/2022 6:00 PM   Critical care end time:  08/08/2022 11:13 PM   Critical care time was exclusive of:  Separately billable procedures and treating other patients and teaching time   Critical care was necessary to treat or prevent imminent or life-threatening deterioration of the  following conditions:  Sepsis and shock   Critical care was time spent personally by me on the following activities:  Obtaining history from patient or surrogate, interpretation of cardiac output measurements, examination of patient, evaluation of patient's response to treatment, discussions with consultants, review of old charts, re-evaluation of patient's condition, pulse oximetry, ordering and review of radiographic studies, ordering and review of laboratory studies and ordering and performing treatments and interventions   Care discussed with: admitting provider     {Document cardiac monitor, telemetry assessment procedure when appropriate:1}  Medications Ordered in ED Medications  lactated ringers infusion ( Intravenous New Bag/Given 08/08/22 1856)  HYDROmorphone (DILAUDID) injection 1 mg (1 mg Intravenous Given 08/08/22 1807)  LORazepam (ATIVAN) injection 2 mg (2 mg Intravenous Given 08/08/22 1731)  ondansetron (ZOFRAN) injection 4 mg (4 mg Intravenous Given 08/08/22 1732)  sodium chloride 0.9 % bolus 1,000 mL (0 mLs Intravenous Stopped 08/08/22 1919)    Followed by  sodium chloride 0.9 % bolus 1,000 mL (0 mLs Intravenous Stopped 08/08/22 1918)  HYDROmorphone (DILAUDID) injection 1 mg (1 mg Intravenous Given 08/08/22 1737)  vancomycin (VANCOCIN) IVPB 1000 mg/200 mL premix (0 mg Intravenous Stopped 08/08/22 1920)  ceFEPIme (MAXIPIME) 2 g in sodium chloride 0.9 % 100 mL IVPB (0 g Intravenous Stopped 08/08/22 1919)  iohexol (OMNIPAQUE) 350 MG/ML injection 100 mL (100 mLs Intravenous Contrast Given 08/08/22 1748)  pantoprazole (PROTONIX) injection 40 mg (40 mg Intravenous Given 08/08/22 1951)  haloperidol lactate (HALDOL) injection 5 mg (5 mg Intravenous Given 08/08/22 1951)  diphenhydrAMINE (BENADRYL) injection 25 mg (25 mg Intravenous Given 08/08/22 1951)    ED Course/ Medical Decision Making/ A&P Clinical Course as of 08/08/22 2310  Sat Aug 08, 2022  1726 I was called emergently to patient's  bedside for unstable vital signs.  Patient endorses a history of 8-10 drinks per day.  Endorses severe right upper quadrant pain that developed throughout the day today. [CC]  1727 Appears to have sepsis uncertain etiology with white count 21, tachycardia.  Appears diaphoretic. [CC]    Clinical Course User Index [CC] Tretha Sciara, MD   {   Click here for ABCD2, HEART and other calculatorsREFRESH Note before signing :1}                          Medical Decision Making Pt complains of severe abdominal pain starting today.  Pt reports felt well yesterday.  Pt denies any medical problems.    Amount and/or Complexity of Data Reviewed Independent Historian: spouse    Details: Pt is here with spouse who is supportive  Labs: ordered. Decision-making details documented in ED Course.    Details: Pt has elevated WBC count of 21.  Lactic acid of 2.5  COVID and influenza are negative Radiology: ordered and independent interpretation performed. Decision-making details documented in ED Course.    Details: Ct angio  chest abdomen and pelvis  obtained.  No acute findings Ultrasound right upper quadrant  no evidence of gallstones or gallbladder disorder ECG/medicine tests: ordered and independent interpretation performed. Decision-making details documented in ED Course.    Details: Tachycardia  Risk Prescription drug management. Risk Details: Emergency department course patient meets criteria for sepsis patient is given IV fluid bolus.  Patient's lactic acid initially is 1.5 when patient is reevaluated and lactic acid is repeated it is 1.3.  Patient's symptoms concerning for sepsis with unknown origin.  Feels better after IV fluids and pain medicine but reports that the pain is beginning to return.     Critical Care Total time providing critical care: 45 minutes     {Document critical care time when appropriate:1} {Document review of labs and clinical decision tools ie heart score, Chads2Vasc2  etc:1}  {Document your independent review of radiology images, and any outside records:1} {Document your discussion with family members, caretakers, and with consultants:1} {Document social determinants of health affecting pt's care:1} {Document your decision making why or why not admission, treatments were needed:1} Final Clinical Impression(s) / ED Diagnoses Final diagnoses:  Sepsis, due to unspecified organism, unspecified whether acute organ dysfunction present [facility])    Rx / DC Orders ED Discharge Orders     None

## 2022-08-08 NOTE — ED Provider Notes (Signed)
[facility] EMERGENCY DEPT Provider Note   CSN: [id number] Arrival date & time: 08/08/22  1341     History  Chief Complaint  Patient presents with   Abdominal Pain    Stephen Reid is a 37 y.o. male.  Pt complains of severe abdominal pain and vomiting.  Symptoms started today.    The history is provided by the patient and the spouse. No language interpreter was used.  Abdominal Pain Pain location:  Generalized Pain quality: sharp and stabbing   Pain radiates to:  LLQ, LUQ, RLQ and RUQ Pain severity:  Severe Onset quality:  Sudden Duration:  1 day Timing:  Constant Progression:  Worsening Chronicity:  New Context: alcohol use   Context: not eating, not medication withdrawal, not sick contacts, not suspicious food intake and not trauma   Relieved by:  Nothing Worsened by:  Nothing Ineffective treatments:  None tried Associated symptoms: anorexia, fever and nausea   Risk factors: alcohol abuse    Patient reports he normally drinks approximately 8 alcoholic beverages a day.  Patient does smoke marijuana he denies any other drug use.  Pt reports he stepped on a nail a week ago     Home Medications Prior to Admission medications   Not on File      Allergies    Patient has no known allergies.    Review of Systems   Review of Systems  Constitutional:  Positive for diaphoresis and fever.  Gastrointestinal:  Positive for abdominal pain, anorexia and nausea.  All other systems reviewed and are negative.   Physical Exam Updated Vital Signs BP (!) 176/133   Pulse (!) 124   Temp 99 F (37.2 C) (Oral)   Resp 18   Ht 5\' 11"  (1.803 m)   Wt 88 kg   SpO2 93%   BMI 27.06 kg/m  Physical Exam Vitals and nursing note reviewed.  Constitutional:      Appearance: He is ill-appearing and diaphoretic.  HENT:     Head: Normocephalic.     Mouth/Throat:     Mouth: Mucous membranes are moist.  Cardiovascular:     Rate and Rhythm: Tachycardia present.   Pulmonary:     Effort: Pulmonary effort is normal.     Breath sounds: Normal breath sounds.  Abdominal:     General: Bowel sounds are normal. There is no distension.     Tenderness: There is generalized abdominal tenderness. There is guarding.     Hernia: There is no hernia in the umbilical area.     Comments: Rash right abdomen,  superficial abrasions (scratches)   Musculoskeletal:        General: Normal range of motion.     Cervical back: Normal range of motion.  Skin:    General: Skin is warm.  Neurological:     General: No focal deficit present.     Mental Status: He is alert and oriented to person, place, and time.  Psychiatric:        Mood and Affect: Mood is anxious.     ED Results / Procedures / Treatments   Labs (all labs ordered are listed, but only abnormal results are displayed) Labs Reviewed  COMPREHENSIVE METABOLIC PANEL - Abnormal; Notable for the following components:      Result Value   Glucose, Bld 123 (*)    AST 106 (*)    ALT 88 (*)    All other components within normal limits  CBC - Abnormal; Notable for the following  components:   WBC 21.7 (*)    Hemoglobin 18.0 (*)    MCH 34.3 (*)    All other components within normal limits  LACTIC ACID, PLASMA - Abnormal; Notable for the following components:   Lactic Acid, Venous 2.5 (*)    All other components within normal limits  CULTURE, BLOOD (SINGLE)  RESP PANEL BY RT-PCR (RSV, FLU A&B, COVID)  RVPGX2  RESPIRATORY PANEL BY PCR  LIPASE, BLOOD  LACTIC ACID, PLASMA  URINALYSIS, ROUTINE W REFLEX MICROSCOPIC  TROPONIN I (HIGH SENSITIVITY)  TROPONIN I (HIGH SENSITIVITY)    EKG None  Radiology US Abdomen Limited RUQ (LIVER/GB)  Result Date: 08/08/2022 CLINICAL DATA:  151471 RUQ pain 151471 EXAM: ULTRASOUND ABDOMEN LIMITED RIGHT UPPER QUADRANT COMPARISON:  None Available. FINDINGS: Gallbladder: No gallstones, pericholecystic fluid or wall thickening visualized. Common bile duct: Not visualized due to  bowel gas. Liver: Liver is hyperechoic consistent with fatty infiltration. No focal hepatic lesions identified. No intrahepatic ductal dilatation. IMPRESSION: Hepatic fatty infiltration. The evaluation was limited by suboptimal visualization. CBD could not be seen. Electronically Signed   By: Layla Maw M.D.   On: 08/08/2022 20:04   CT Angio Chest/Abd/Pel for Dissection W and/or Wo Contrast  Result Date: 08/08/2022 CLINICAL DATA:  Aortic aneurysm suspected abdominal pain EXAM: CT ANGIOGRAPHY CHEST, ABDOMEN AND PELVIS TECHNIQUE: Non-contrast CT of the chest was initially obtained. Multidetector CT imaging through the chest, abdomen and pelvis was performed using the standard protocol during bolus administration of intravenous contrast. Multiplanar reconstructed images and MIPs were obtained and reviewed to evaluate the vascular anatomy. RADIATION DOSE REDUCTION: This exam was performed according to the departmental dose-optimization program which includes automated exposure control, adjustment of the mA and/or kV according to patient size and/or use of iterative reconstruction technique. CONTRAST:  OMNIPAQUE IOHEXOL 350 MG/ML SOLN COMPARISON:  None Available. FINDINGS: CTA CHEST FINDINGS Cardiovascular: Heart is normal size. Aorta is normal caliber. No evidence of aortic dissection. No visible pulmonary embolus. Mediastinum/Nodes: No mediastinal, hilar, or axillary adenopathy. Trachea and esophagus are unremarkable. Thyroid unremarkable. Small hiatal hernia. Lungs/Pleura: Lungs are clear. No focal airspace opacities or suspicious nodules. No effusions. Musculoskeletal: Chest wall soft tissues are unremarkable. No acute bony abnormality. Review of the MIP images confirms the above findings. CTA ABDOMEN AND PELVIS FINDINGS VASCULAR Aorta: Normal caliber aorta without aneurysm, dissection, vasculitis or significant stenosis. Celiac: Patent without evidence of aneurysm, dissection, vasculitis or  significant stenosis. SMA: Patent without evidence of aneurysm, dissection, vasculitis or significant stenosis. Renals: Both renal arteries are patent without evidence of aneurysm, dissection, vasculitis, fibromuscular dysplasia or significant stenosis. IMA: Patent without evidence of aneurysm, dissection, vasculitis or significant stenosis. Inflow: Patent without evidence of aneurysm, dissection, vasculitis or significant stenosis. Veins: No obvious venous abnormality within the limitations of this arterial phase study. Review of the MIP images confirms the above findings. NON-VASCULAR Hepatobiliary: Diffuse low-density throughout the liver compatible with fatty infiltration. No focal abnormality. Gallbladder unremarkable. Pancreas: No focal abnormality or ductal dilatation. Spleen: No focal abnormality.  Normal size. Adrenals/Urinary Tract: No adrenal abnormality. No focal renal abnormality. No stones or hydronephrosis. Urinary bladder is unremarkable. Stomach/Bowel: Normal appendix. Stomach, large and small bowel grossly unremarkable. Lymphatic: No adenopathy Reproductive: No visible focal abnormality. Other: No free fluid or free air. Musculoskeletal: No acute bony abnormality. Review of the MIP images confirms the above findings. IMPRESSION: No evidence of aortic aneurysm or dissection. No evidence of pulmonary embolus. No acute findings in the chest, abdomen or pelvis. Small  hiatal hernia. Hepatic steatosis. Electronically Signed   By: Rolm Baptise M.D.   On: 08/08/2022 18:16    Procedures .Critical Care  Performed by: Fransico Meadow, PA-C Authorized by: Fransico Meadow, PA-C   Critical care provider statement:    Critical care time (minutes):  45   Critical care start time:  08/08/2022 6:00 PM   Critical care end time:  08/08/2022 11:13 PM   Critical care time was exclusive of:  Separately billable procedures and treating other patients and teaching time   Critical care was necessary to treat or  prevent imminent or life-threatening deterioration of the following conditions:  Sepsis and shock   Critical care was time spent personally by me on the following activities:  Obtaining history from patient or surrogate, interpretation of cardiac output measurements, examination of patient, evaluation of patient's response to treatment, discussions with consultants, review of old charts, re-evaluation of patient's condition, pulse oximetry, ordering and review of radiographic studies, ordering and review of laboratory studies and ordering and performing treatments and interventions   Care discussed with: admitting provider       Medications Ordered in ED Medications  lactated ringers infusion ( Intravenous New Bag/Given 08/08/22 1856)  HYDROmorphone (DILAUDID) injection 1 mg (1 mg Intravenous Given 08/08/22 1807)  LORazepam (ATIVAN) injection 2 mg (2 mg Intravenous Given 08/08/22 1731)  ondansetron (ZOFRAN) injection 4 mg (4 mg Intravenous Given 08/08/22 1732)  sodium chloride 0.9 % bolus 1,000 mL (0 mLs Intravenous Stopped 08/08/22 1919)    Followed by  sodium chloride 0.9 % bolus 1,000 mL (0 mLs Intravenous Stopped 08/08/22 1918)  HYDROmorphone (DILAUDID) injection 1 mg (1 mg Intravenous Given 08/08/22 1737)  vancomycin (VANCOCIN) IVPB 1000 mg/200 mL premix (0 mg Intravenous Stopped 08/08/22 1920)  ceFEPIme (MAXIPIME) 2 g in sodium chloride 0.9 % 100 mL IVPB (0 g Intravenous Stopped 08/08/22 1919)  iohexol (OMNIPAQUE) 350 MG/ML injection 100 mL (100 mLs Intravenous Contrast Given 08/08/22 1748)  pantoprazole (PROTONIX) injection 40 mg (40 mg Intravenous Given 08/08/22 1951)  haloperidol lactate (HALDOL) injection 5 mg (5 mg Intravenous Given 08/08/22 1951)  diphenhydrAMINE (BENADRYL) injection 25 mg (25 mg Intravenous Given 08/08/22 1951)    ED Course/ Medical Decision Making/ A&P Clinical Course as of 08/08/22 2310  Sat Aug 08, 2022  1726 I was called emergently to patient's bedside for unstable  vital signs.  Patient endorses a history of 8-10 drinks per day.  Endorses severe right upper quadrant pain that developed throughout the day today. [CC]  1727 Appears to have sepsis uncertain etiology with white count 21, tachycardia.  Appears diaphoretic. [CC]    Clinical Course User Index [CC] Tretha Sciara, MD                             Medical Decision Making Pt complains of severe abdominal pain starting today.  Pt reports felt well yesterday.  Pt denies any medical problems.    Amount and/or Complexity of Data Reviewed Independent Historian: spouse    Details: Pt is here with spouse who is supportive  Labs: ordered. Decision-making details documented in ED Course.    Details: Pt has elevated WBC count of 21.  Lactic acid of 2.5  COVID and influenza are negative Radiology: ordered and independent interpretation performed. Decision-making details documented in ED Course.    Details: Ct angio  chest abdomen and pelvis  obtained.  No acute findings Ultrasound right upper quadrant  no evidence of gallstones or gallbladder disorder ECG/medicine tests: ordered and independent interpretation performed. Decision-making details documented in ED Course.    Details: Tachycardia Discussion of management or test interpretation with external provider(s): I spoke with Dr. Blima Singer  Hospitalist who will admit   Risk Prescription drug management. Risk Details: Emergency department course patient meets criteria for sepsis patient is given IV fluid bolus.  Patient's lactic acid initially is 1.5 when patient is reevaluated and lactic acid is repeated it is 1.3.  Patient's symptoms concerning for sepsis with unknown origin.  Feels better after IV fluids and pain medicine but reports that the pain is beginning to return.     Critical Care Total time providing critical care: 45 minutes           Final Clinical Impression(s) / ED Diagnoses Final diagnoses:  Sepsis, due to unspecified  organism, unspecified whether acute organ dysfunction present [facility])    Rx / DC Orders ED Discharge Orders     None         Sidney Ace 08/09/22 0008    Truddie Hidden, MD 08/09/22 [phone]

## 2022-08-08 NOTE — ED Triage Notes (Signed)
Patient here POV from Home.  Endorses ABD Pain since 0600 that awakened the Patient. Associated with N/V. No Diarrhea. No Fevers.  NAD Noted during Triage. A&Ox4. GCS 15. Ambulatory.

## 2022-08-09 ENCOUNTER — Encounter (HOSPITAL_COMMUNITY): Payer: Self-pay | Admitting: Internal Medicine

## 2022-08-09 DIAGNOSIS — K7010 Alcoholic hepatitis without ascites: Secondary | ICD-10-CM | POA: Diagnosis not present

## 2022-08-09 DIAGNOSIS — K760 Fatty (change of) liver, not elsewhere classified: Secondary | ICD-10-CM | POA: Diagnosis not present

## 2022-08-09 DIAGNOSIS — Z811 Family history of alcohol abuse and dependence: Secondary | ICD-10-CM | POA: Diagnosis not present

## 2022-08-09 DIAGNOSIS — Z1152 Encounter for screening for COVID-19: Secondary | ICD-10-CM | POA: Diagnosis not present

## 2022-08-09 DIAGNOSIS — R109 Unspecified abdominal pain: Secondary | ICD-10-CM

## 2022-08-09 DIAGNOSIS — F1210 Cannabis abuse, uncomplicated: Secondary | ICD-10-CM

## 2022-08-09 DIAGNOSIS — I10 Essential (primary) hypertension: Secondary | ICD-10-CM | POA: Diagnosis not present

## 2022-08-09 DIAGNOSIS — Z833 Family history of diabetes mellitus: Secondary | ICD-10-CM | POA: Diagnosis not present

## 2022-08-09 DIAGNOSIS — K2920 Alcoholic gastritis without bleeding: Secondary | ICD-10-CM | POA: Diagnosis not present

## 2022-08-09 DIAGNOSIS — R1110 Vomiting, unspecified: Secondary | ICD-10-CM | POA: Diagnosis present

## 2022-08-09 DIAGNOSIS — F10239 Alcohol dependence with withdrawal, unspecified: Secondary | ICD-10-CM | POA: Diagnosis not present

## 2022-08-09 DIAGNOSIS — F1020 Alcohol dependence, uncomplicated: Secondary | ICD-10-CM

## 2022-08-09 DIAGNOSIS — J690 Pneumonitis due to inhalation of food and vomit: Secondary | ICD-10-CM | POA: Diagnosis not present

## 2022-08-09 DIAGNOSIS — E872 Acidosis, unspecified: Secondary | ICD-10-CM | POA: Diagnosis not present

## 2022-08-09 DIAGNOSIS — F17200 Nicotine dependence, unspecified, uncomplicated: Secondary | ICD-10-CM

## 2022-08-09 DIAGNOSIS — Z79899 Other long term (current) drug therapy: Secondary | ICD-10-CM | POA: Diagnosis not present

## 2022-08-09 DIAGNOSIS — F17210 Nicotine dependence, cigarettes, uncomplicated: Secondary | ICD-10-CM | POA: Diagnosis not present

## 2022-08-09 DIAGNOSIS — E860 Dehydration: Secondary | ICD-10-CM | POA: Diagnosis not present

## 2022-08-09 DIAGNOSIS — E876 Hypokalemia: Secondary | ICD-10-CM | POA: Diagnosis not present

## 2022-08-09 HISTORY — DX: Unspecified abdominal pain: R10.9

## 2022-08-09 LAB — RESPIRATORY PANEL BY PCR

## 2022-08-09 LAB — RAPID URINE DRUG SCREEN, HOSP PERFORMED
Amphetamines: NOT DETECTED
Barbiturates: NOT DETECTED
Benzodiazepines: POSITIVE — AB
Cocaine: NOT DETECTED
Opiates: NOT DETECTED
Tetrahydrocannabinol: NOT DETECTED

## 2022-08-09 LAB — HIV ANTIBODY (ROUTINE TESTING W REFLEX): HIV Screen 4th Generation wRfx: NONREACTIVE

## 2022-08-09 MED ORDER — ALBUTEROL SULFATE (2.5 MG/3ML) 0.083% IN NEBU: 2.5000 mg | INHALATION_SOLUTION | RESPIRATORY_TRACT | Status: DC | PRN

## 2022-08-09 MED ORDER — ENOXAPARIN SODIUM 60 MG/0.6ML IJ SOSY
0.5000 mg/kg | PREFILLED_SYRINGE | INTRAMUSCULAR | Status: DC
  Administered 2022-08-09 – 2022-08-10 (×2): 55 mg via SUBCUTANEOUS
  Filled 2022-08-09 (×2): qty 0.6

## 2022-08-09 MED ORDER — SUCRALFATE 1 G PO TABS
1.0000 g | ORAL_TABLET | Freq: Three times a day (TID) | ORAL | Status: DC
  Administered 2022-08-09 – 2022-08-12 (×12): 1 g via ORAL
  Filled 2022-08-09 (×12): qty 1

## 2022-08-09 MED ORDER — LORAZEPAM 1 MG PO TABS
1.0000 mg | ORAL_TABLET | ORAL | Status: AC | PRN
  Administered 2022-08-09 – 2022-08-11 (×4): 1 mg via ORAL
  Filled 2022-08-09 (×5): qty 1

## 2022-08-09 MED ORDER — ONDANSETRON HCL 4 MG/2ML IJ SOLN
4.0000 mg | Freq: Four times a day (QID) | INTRAMUSCULAR | Status: DC | PRN
  Administered 2022-08-10: 4 mg via INTRAVENOUS
  Filled 2022-08-09: qty 2

## 2022-08-09 MED ORDER — FOLIC ACID 1 MG PO TABS
1.0000 mg | ORAL_TABLET | Freq: Every day | ORAL | Status: DC
  Administered 2022-08-09 – 2022-08-12 (×4): 1 mg via ORAL
  Filled 2022-08-09 (×4): qty 1

## 2022-08-09 MED ORDER — MORPHINE SULFATE (PF) 2 MG/ML IV SOLN: 2.0000 mg | INTRAVENOUS | Status: DC | PRN

## 2022-08-09 MED ORDER — PANTOPRAZOLE SODIUM 40 MG PO TBEC
40.0000 mg | DELAYED_RELEASE_TABLET | Freq: Two times a day (BID) | ORAL | Status: DC
  Administered 2022-08-09 – 2022-08-10 (×3): 40 mg via ORAL
  Filled 2022-08-09 (×3): qty 1

## 2022-08-09 MED ORDER — DOCUSATE SODIUM 100 MG PO CAPS
100.0000 mg | ORAL_CAPSULE | Freq: Two times a day (BID) | ORAL | Status: DC
  Administered 2022-08-09 – 2022-08-12 (×5): 100 mg via ORAL
  Filled 2022-08-09 (×6): qty 1

## 2022-08-09 MED ORDER — BISACODYL 5 MG PO TBEC: 5.0000 mg | DELAYED_RELEASE_TABLET | Freq: Every day | ORAL | Status: DC | PRN

## 2022-08-09 MED ORDER — SODIUM CHLORIDE 0.9% FLUSH
3.0000 mL | Freq: Two times a day (BID) | INTRAVENOUS | Status: DC
  Administered 2022-08-09 – 2022-08-11 (×4): 3 mL via INTRAVENOUS

## 2022-08-09 MED ORDER — LORAZEPAM 2 MG/ML IJ SOLN
1.0000 mg | INTRAMUSCULAR | Status: AC | PRN
  Administered 2022-08-09: 2 mg via INTRAVENOUS
  Administered 2022-08-10: 1 mg via INTRAVENOUS
  Filled 2022-08-09 (×2): qty 1

## 2022-08-09 MED ORDER — POLYETHYLENE GLYCOL 3350 17 G PO PACK: 17.0000 g | PACK | Freq: Every day | ORAL | Status: DC | PRN

## 2022-08-09 MED ORDER — ACETAMINOPHEN 325 MG PO TABS
650.0000 mg | ORAL_TABLET | Freq: Four times a day (QID) | ORAL | Status: DC | PRN
  Administered 2022-08-09: 650 mg via ORAL
  Filled 2022-08-09: qty 2

## 2022-08-09 MED ORDER — OXYCODONE HCL 5 MG PO TABS
5.0000 mg | ORAL_TABLET | ORAL | Status: DC | PRN
  Administered 2022-08-09 – 2022-08-10 (×3): 5 mg via ORAL
  Filled 2022-08-09 (×3): qty 1

## 2022-08-09 MED ORDER — ONDANSETRON HCL 4 MG PO TABS: 4.0000 mg | ORAL_TABLET | Freq: Four times a day (QID) | ORAL | Status: DC | PRN

## 2022-08-09 MED ORDER — ENOXAPARIN SODIUM 40 MG/0.4ML IJ SOSY: 40.0000 mg | PREFILLED_SYRINGE | INTRAMUSCULAR | Status: DC

## 2022-08-09 MED ORDER — ADULT MULTIVITAMIN W/MINERALS CH
1.0000 | ORAL_TABLET | Freq: Every day | ORAL | Status: DC
  Administered 2022-08-09 – 2022-08-12 (×4): 1 via ORAL
  Filled 2022-08-09 (×4): qty 1

## 2022-08-09 MED ORDER — LACTATED RINGERS IV SOLN: INTRAVENOUS | Status: DC

## 2022-08-09 MED ORDER — HYDRALAZINE HCL 20 MG/ML IJ SOLN
5.0000 mg | INTRAMUSCULAR | Status: DC | PRN
  Administered 2022-08-09 – 2022-08-10 (×2): 5 mg via INTRAVENOUS
  Filled 2022-08-09 (×3): qty 1

## 2022-08-09 MED ORDER — NICOTINE 14 MG/24HR TD PT24
14.0000 mg | MEDICATED_PATCH | Freq: Every day | TRANSDERMAL | Status: DC
  Filled 2022-08-09 (×4): qty 1

## 2022-08-09 MED ORDER — ACETAMINOPHEN 650 MG RE SUPP: 650.0000 mg | Freq: Four times a day (QID) | RECTAL | Status: DC | PRN

## 2022-08-09 NOTE — ED Notes (Signed)
I have given phone report to Shauna Hugh, Therapist, sports at [facility]; and to [employer], Therapist, sports at [facility] . [facility] has just left with pt. Now.

## 2022-08-09 NOTE — Plan of Care (Signed)

## 2022-08-09 NOTE — Progress Notes (Signed)
Plan of Care Note for accepted transfer   Patient: Stephen Reid MRN: [id number]   DOA: 08/08/2022  Facility requesting transfer: [facility]   Requesting Provider: Caryl Ada, PA   Reason for transfer: Abdominal pain, N/V, SIRS   Facility course: 37 yr old man who has ~8 alcoholic beverages daily p/w acute-onset abdominal pain with N/V. He is afebrile with HR in 120s, WBC 21.7, AST 106, ALT 88, normal lipase, and lactate of 2.5. No acute intrathoracic, abdominal, or pelvic findings on CT. He was given 2 liters LR, IV PPI, Zofran, Ativan, Benadryl, Haldol, Dilaudid, vancomycin, and cefepime.   Plan of care: The patient is accepted for admission to Telemetry unit, at [facility].   Author: Vianne Bulls, MD 08/09/2022  Check [email or web address] for on-call coverage.  Nursing staff, Please call [place] number on Amion as soon as patient's arrival, so appropriate admitting provider can evaluate the pt.

## 2022-08-09 NOTE — Progress Notes (Signed)
New Admission Note:   Arrival Method: ambulance via stretcher Mental Orientation: aa+ox4 Telemetry: 15 Assessment: Completed Skin: intact IV: left antecub Pain: 8/10 Tubes: none Safety Measures: Safety Fall Prevention Plan has been given, discussed and signed Admission: Completed [facility] Orientation: Patient has been orientated to the room, unit and staff.  Family:  Orders have been reviewed and implemented. Will continue to monitor the patient. Call light has been placed within reach and bed alarm has been activated. MD aware of pt. Arrival, VS and assessment, will come see pt.  Anastasio Auerbach, RN

## 2022-08-09 NOTE — Plan of Care (Signed)
  Problem: Education: Goal: Knowledge of General Education information will improve Description: Including pain rating scale, medication(s)/side effects and non-pharmacologic comfort measures Outcome: Completed/Met

## 2022-08-09 NOTE — H&P (Signed)
History and Physical    Patient: Stephen Reid DXI:[id number] DOB: 01-Apr-1986 DOA: 08/08/2022 DOS: the patient was seen and examined on 08/09/2022 PCP: Pcp, No  Patient coming from: Home - lives with wife; Jackey Loge: Wife, [phone]   Chief Complaint: abdominal pain  HPI: Stephen Reid is a 37 y.o. male with medical history significant of alcohol dependence presenting with abdominal pain.  He awoke at 0600 yesterday AM with persistent nausea, some vomiting, severe abdominal pain - starting midepigastric and migrated to RLQ.  Painful with palpation and movement.    Always present, worse with movement.  Nausea resolved about 2pm, vomited 0600-1330 but this resolved. Last BM was mid-day Friday, normal.  Drinks 4-5 cocktails daily.  He now thinks he had a problem with ETOH.  He also smokes marijuana.  No h/o DTs.    ER Course:  [facility] transfer, per Dr. Antionette Char:  Acute-onset abdominal pain with N/V. He is afebrile with HR in 120s, WBC 21.7, AST 106, ALT 88, normal lipase, and lactate of 2.5. No acute intrathoracic, abdominal, or pelvic findings on CT. He was given 2 liters LR, IV PPI, Zofran, Ativan, Benadryl, Haldol, Dilaudid, vancomycin, and cefepime.       Review of Systems: As mentioned in the history of present illness. All other systems reviewed and are negative. Past Medical History:  Diagnosis Date   Alcohol dependence (HCC)    Marijuana abuse    History reviewed. No pertinent surgical history. Social History:  reports that he has been smoking cigarettes. He has a 46.00 pack-year smoking history. He does not have any smokeless tobacco history on file. He reports current alcohol use. He reports current drug use. Drug: Marijuana.  No Known Allergies  Family History  Problem Relation Age of Onset   Alcohol abuse Father    Diabetes Maternal Grandmother    Diabetes Maternal Grandfather    Alcohol abuse Paternal Uncle     Prior to Admission medications   Not on File     Physical Exam: Vitals:   08/09/22 0536 08/09/22 0730 08/09/22 0839 08/09/22 1018  BP: (!) 168/122 (!) 180/119 (!) 164/129 (!) 160/115  Pulse: (!) 128 (!) 105 (!) 103   Resp:  17 18   Temp:  99 F (37.2 C) 98.5 F (36.9 C)   TempSrc:  Oral Oral   SpO2:  97% 94%   Weight:   109.4 kg   Height:   5\' 11"  (1.803 m)    General:  Appears calm and comfortable and is in NAD Eyes:  EOMI, normal lids, iris ENT:  grossly normal hearing, lips & tongue, mmm; appropriate dentition Neck:  no LAD, masses or thyromegaly Cardiovascular:  RR with mild tachycardia, no m/r/g. No LE edema.  Respiratory:   CTA bilaterally with no wheezes/rales/rhonchi.  Normal respiratory effort. Abdomen:  soft, mildly diffusely TTP but worst in RUQ > midepigastric region Skin:  no rash or induration seen on limited exam Musculoskeletal:  grossly normal tone BUE/BLE, good ROM, no bony abnormality Psychiatric:  blunted mood and affect, speech fluent and appropriate, AOx3 Neurologic:  CN 2-12 grossly intact, moves all extremities in coordinated fashion   Radiological Exams on Admission: Independently reviewed - see discussion in A/P where applicable  Abdomen Limited RUQ (LIVER/GB)  Result Date: 08/08/2022 CLINICAL DATA:  151471 RUQ pain 151471 EXAM: ULTRASOUND ABDOMEN LIMITED RIGHT UPPER QUADRANT COMPARISON:  None Available. FINDINGS: Gallbladder: No gallstones, pericholecystic fluid or wall thickening visualized. Common bile duct: Not visualized  due to bowel gas. Liver: Liver is hyperechoic consistent with fatty infiltration. No focal hepatic lesions identified. No intrahepatic ductal dilatation. IMPRESSION: Hepatic fatty infiltration. The evaluation was limited by suboptimal visualization. CBD could not be seen. Electronically Signed   By: Sammie Bench M.D.   On: 08/08/2022 20:04   CT Angio Chest/Abd/Pel for Dissection W and/or Wo Contrast  Result Date: 08/08/2022 CLINICAL DATA:  Aortic aneurysm suspected  abdominal pain EXAM: CT ANGIOGRAPHY CHEST, ABDOMEN AND PELVIS TECHNIQUE: Non-contrast CT of the chest was initially obtained. Multidetector CT imaging through the chest, abdomen and pelvis was performed using the standard protocol during bolus administration of intravenous contrast. Multiplanar reconstructed images and MIPs were obtained and reviewed to evaluate the vascular anatomy. RADIATION DOSE REDUCTION: This exam was performed according to the departmental dose-optimization program which includes automated exposure control, adjustment of the mA and/or kV according to patient size and/or use of iterative reconstruction technique. CONTRAST:  173mL OMNIPAQUE IOHEXOL 350 MG/ML SOLN COMPARISON:  None Available. FINDINGS: CTA CHEST FINDINGS Cardiovascular: Heart is normal size. Aorta is normal caliber. No evidence of aortic dissection. No visible pulmonary embolus. Mediastinum/Nodes: No mediastinal, hilar, or axillary adenopathy. Trachea and esophagus are unremarkable. Thyroid unremarkable. Small hiatal hernia. Lungs/Pleura: Lungs are clear. No focal airspace opacities or suspicious nodules. No effusions. Musculoskeletal: Chest wall soft tissues are unremarkable. No acute bony abnormality. Review of the MIP images confirms the above findings. CTA ABDOMEN AND PELVIS FINDINGS VASCULAR Aorta: Normal caliber aorta without aneurysm, dissection, vasculitis or significant stenosis. Celiac: Patent without evidence of aneurysm, dissection, vasculitis or significant stenosis. SMA: Patent without evidence of aneurysm, dissection, vasculitis or significant stenosis. Renals: Both renal arteries are patent without evidence of aneurysm, dissection, vasculitis, fibromuscular dysplasia or significant stenosis. IMA: Patent without evidence of aneurysm, dissection, vasculitis or significant stenosis. Inflow: Patent without evidence of aneurysm, dissection, vasculitis or significant stenosis. Veins: No obvious venous abnormality within  the limitations of this arterial phase study. Review of the MIP images confirms the above findings. NON-VASCULAR Hepatobiliary: Diffuse low-density throughout the liver compatible with fatty infiltration. No focal abnormality. Gallbladder unremarkable. Pancreas: No focal abnormality or ductal dilatation. Spleen: No focal abnormality.  Normal size. Adrenals/Urinary Tract: No adrenal abnormality. No focal renal abnormality. No stones or hydronephrosis. Urinary bladder is unremarkable. Stomach/Bowel: Normal appendix. Stomach, large and small bowel grossly unremarkable. Lymphatic: No adenopathy Reproductive: No visible focal abnormality. Other: No free fluid or free air. Musculoskeletal: No acute bony abnormality. Review of the MIP images confirms the above findings. IMPRESSION: No evidence of aortic aneurysm or dissection. No evidence of pulmonary embolus. No acute findings in the chest, abdomen or pelvis. Small hiatal hernia. Hepatic steatosis. Electronically Signed   By: Rolm Baptise M.D.   On: 08/08/2022 18:16    EKG: Independently reviewed.  Sinus tachycardia with rate 110; no evidence of acute ischemia   Labs on Admission: I have personally reviewed the available labs and imaging studies at the time of the admission.  Pertinent labs:    Glucose 123 AST 106/ALT 88 HS troponin 4 Lactate 2.5, 1.3 WBC 21.7 Unremarkable UA   Assessment and Plan: Principal Problem:   Abdominal pain with vomiting Active Problems:   Alcohol dependence (HCC)   Marijuana abuse   Tobacco dependence    Abdominal pain -Patient without known medical history or prior apparent recognition about ETOH problem presenting with acute onset of abdominal pain and n/v -Abdominal pain is persistent -Imaging unremarkable -Elevated LFTs -RUQ [place] with fatty liver -Suspect alcoholic  gastritis and/or hepatitis -Will start BID PO Protonix and Carafate -Clear liquids, advance diet as tolerated to bland -Observe overnight on  telemetry  ETOH dependence -Patient with chronic ETOH dependence -Number of drinks per day: 5+ -Number of admissions for management of alcohol withdrawal syndrome (detox): 0 -History of withdrawal seizures, ICU admissions, DTs: denies -Will observe on telemetry for now -CIWA protocol -Folate, thiamine, and MVI ordered -Will provide symptom-triggered BZD (ativan per CIWA protocol) only since the patient is able to communicate; is not showing current signs of delirium; and has no history of severe withdrawal. -TOC team consult for substance abuse counseling -Will also check UDS. -Elevated LFTs are likely related to alcoholism -Consider offering a medication for Alcohol Use Disorder at the time of d/c, to include Disulfuram; Naltrexone; or Acamprosate.  Marijuana abuse -Cessation encouraged; this should be encouraged on an ongoing basis -UDS ordered   Tobacco dependence -Encourage cessation.   -This was discussed with the patient and should be reviewed on an ongoing basis.   -Patch ordered   Advance Care Planning:   Code Status: Full Code - Code status was discussed with the patient and/or family at the time of admission.  The patient would want to receive full resuscitative measures at this time.   Consults: TOC team  DVT Prophylaxis: Lovenox  Family Communication: Wife was present throughout evaluation  Severity of Illness: The appropriate patient status for this patient is OBSERVATION. Observation status is judged to be reasonable and necessary in order to provide the required intensity of service to ensure the patient's safety. The patient's presenting symptoms, physical exam findings, and initial radiographic and laboratory data in the context of their medical condition is felt to place them at decreased risk for further clinical deterioration. Furthermore, it is anticipated that the patient will be medically stable for discharge from the hospital within 2 midnights of admission.    Author: Karmen Bongo, MD 08/09/2022 11:27 AM  For on call review [email or web address].

## 2022-08-10 DIAGNOSIS — F1210 Cannabis abuse, uncomplicated: Secondary | ICD-10-CM | POA: Diagnosis present

## 2022-08-10 DIAGNOSIS — E860 Dehydration: Secondary | ICD-10-CM | POA: Diagnosis present

## 2022-08-10 DIAGNOSIS — J690 Pneumonitis due to inhalation of food and vomit: Secondary | ICD-10-CM | POA: Diagnosis present

## 2022-08-10 DIAGNOSIS — E872 Acidosis, unspecified: Secondary | ICD-10-CM | POA: Diagnosis present

## 2022-08-10 DIAGNOSIS — Z79899 Other long term (current) drug therapy: Secondary | ICD-10-CM | POA: Diagnosis not present

## 2022-08-10 DIAGNOSIS — E876 Hypokalemia: Secondary | ICD-10-CM | POA: Diagnosis present

## 2022-08-10 DIAGNOSIS — Z833 Family history of diabetes mellitus: Secondary | ICD-10-CM | POA: Diagnosis not present

## 2022-08-10 DIAGNOSIS — R1110 Vomiting, unspecified: Secondary | ICD-10-CM | POA: Diagnosis not present

## 2022-08-10 DIAGNOSIS — K7010 Alcoholic hepatitis without ascites: Secondary | ICD-10-CM | POA: Diagnosis present

## 2022-08-10 DIAGNOSIS — Z811 Family history of alcohol abuse and dependence: Secondary | ICD-10-CM | POA: Diagnosis not present

## 2022-08-10 DIAGNOSIS — F10239 Alcohol dependence with withdrawal, unspecified: Secondary | ICD-10-CM | POA: Diagnosis present

## 2022-08-10 DIAGNOSIS — K760 Fatty (change of) liver, not elsewhere classified: Secondary | ICD-10-CM | POA: Diagnosis present

## 2022-08-10 DIAGNOSIS — K2920 Alcoholic gastritis without bleeding: Secondary | ICD-10-CM | POA: Diagnosis present

## 2022-08-10 DIAGNOSIS — I10 Essential (primary) hypertension: Secondary | ICD-10-CM | POA: Diagnosis present

## 2022-08-10 DIAGNOSIS — Z1152 Encounter for screening for COVID-19: Secondary | ICD-10-CM | POA: Diagnosis not present

## 2022-08-10 DIAGNOSIS — R109 Unspecified abdominal pain: Secondary | ICD-10-CM | POA: Diagnosis present

## 2022-08-10 DIAGNOSIS — F17210 Nicotine dependence, cigarettes, uncomplicated: Secondary | ICD-10-CM | POA: Diagnosis present

## 2022-08-10 LAB — CBC
HCT: 47.8 % (ref 39.0–52.0)
Hemoglobin: 16.7 g/dL (ref 13.0–17.0)
MCH: 35.1 pg — ABNORMAL HIGH (ref 26.0–34.0)
MCHC: 34.9 g/dL (ref 30.0–36.0)
MCV: 100.4 fL — ABNORMAL HIGH (ref 80.0–100.0)
Platelets: 149 10*3/uL — ABNORMAL LOW (ref 150–400)
RBC: 4.76 MIL/uL (ref 4.22–5.81)
RDW: 12.5 % (ref 11.5–15.5)
WBC: 11.5 10*3/uL — ABNORMAL HIGH (ref 4.0–10.5)
nRBC: 0 % (ref 0.0–0.2)

## 2022-08-10 LAB — COMPREHENSIVE METABOLIC PANEL
ALT: 48 U/L — ABNORMAL HIGH (ref 0–44)
AST: 40 U/L (ref 15–41)
Albumin: 3.1 g/dL — ABNORMAL LOW (ref 3.5–5.0)
Alkaline Phosphatase: 78 U/L (ref 38–126)
Anion gap: 14 (ref 5–15)
BUN: 6 mg/dL (ref 6–20)
CO2: 26 mmol/L (ref 22–32)
Calcium: 9.1 mg/dL (ref 8.9–10.3)
Chloride: 95 mmol/L — ABNORMAL LOW (ref 98–111)
Creatinine, Ser: 0.81 mg/dL (ref 0.61–1.24)
GFR, Estimated: >60 mL/min (ref >60)
Glucose, Bld: 91 mg/dL (ref 70–99)
Potassium: 3.2 mmol/L — ABNORMAL LOW (ref 3.5–5.1)
Sodium: 135 mmol/L (ref 135–145)
Total Bilirubin: 2.4 mg/dL — ABNORMAL HIGH (ref 0.3–1.2)
Total Protein: 6.5 g/dL (ref 6.5–8.1)

## 2022-08-10 LAB — PROTIME-INR
INR: 1.1 (ref 0.8–1.2)
Prothrombin Time: 13.7 seconds (ref 11.4–15.2)

## 2022-08-10 MED ORDER — PANTOPRAZOLE SODIUM 40 MG IV SOLR
40.0000 mg | Freq: Two times a day (BID) | INTRAVENOUS | Status: DC
  Administered 2022-08-10 – 2022-08-11 (×4): 40 mg via INTRAVENOUS
  Filled 2022-08-10 (×5): qty 10

## 2022-08-10 MED ORDER — LACTATED RINGERS IV BOLUS
500.0000 mL | Freq: Once | INTRAVENOUS | Status: AC
  Administered 2022-08-10: 500 mL via INTRAVENOUS

## 2022-08-10 MED ORDER — HYDRALAZINE HCL 20 MG/ML IJ SOLN: 10.0000 mg | INTRAMUSCULAR | Status: DC | PRN

## 2022-08-10 MED ORDER — ENOXAPARIN SODIUM 60 MG/0.6ML IJ SOSY
50.0000 mg | PREFILLED_SYRINGE | Freq: Every day | INTRAMUSCULAR | Status: DC
  Filled 2022-08-10 (×2): qty 0.6

## 2022-08-10 MED ORDER — POTASSIUM CHLORIDE CRYS ER 20 MEQ PO TBCR
40.0000 meq | EXTENDED_RELEASE_TABLET | ORAL | Status: DC
  Administered 2022-08-10: 40 meq via ORAL
  Filled 2022-08-10: qty 2

## 2022-08-10 MED ORDER — METOPROLOL TARTRATE 5 MG/5ML IV SOLN
5.0000 mg | Freq: Four times a day (QID) | INTRAVENOUS | Status: DC | PRN
  Administered 2022-08-10: 5 mg via INTRAVENOUS
  Filled 2022-08-10: qty 5

## 2022-08-10 MED ORDER — POTASSIUM CHLORIDE 10 MEQ/100ML IV SOLN
10.0000 meq | INTRAVENOUS | Status: AC
  Administered 2022-08-10 (×4): 10 meq via INTRAVENOUS
  Filled 2022-08-10 (×4): qty 100

## 2022-08-10 NOTE — Plan of Care (Signed)

## 2022-08-10 NOTE — Progress Notes (Signed)
PROGRESS NOTE    Stephen Reid  BSW:[id number] DOB: 05-05-1986 DOA: 08/08/2022 PCP: Pcp, No   Brief Narrative: 37 year old with past medical history significant for alcohol dependence presented with abdominal pain.  Patient developed nausea vomiting severe abdominal pain.  The morning prior to admission.  Reported midepigastric radiating right lower quadrant pain.  Worse with movement.  He drinks 4-5 cocktails daily.  Evaluation in the ED he was found to be tachycardic heart rate 120, leukocytosis white count 21, transaminases, normal lipase, lactic acid 2.5.  CT chest abdomen and pelvis no acute abnormality.  Patient admitted with gastritis, mild hepatitis from alcohol and alcohol withdrawal.    Assessment & Plan:   Principal Problem:   Abdominal pain with vomiting Active Problems:   Alcohol dependence (HCC)   Marijuana abuse   Tobacco dependence   1-Abdominal Pain: Gastritis.  Patient presented with abdominal pain, nausea vomiting.  Imaging unremarkable.  Transaminases Suspect related to alcoholic gastritis and or hepatitis Still vomiting, change PPI to IV Continue with full liquid diet CT angio chest abdomen and pelvis: No evidence of aortic aneurysm.  No evidence of PE.  No acute finding chest abdomen and pelvis. Right upper quadrant ultrasound; hepatic fatty liver.  Gallbladder no gallstones or pericholecystic fluid.  Common bile duct not visualized due to bowel gas.   2- EtOH dependence: Now with alcohol withdrawal Continue with CIWA protocol, thiamine folic acid and Ativan Will see consulted for substance abuse counseling Could Consider naltrexone at discharge  3-tobacco dependence: Encourage cessation  4-Hypertension, tachycardia could be related to alcohol withdrawal: As needed hydralazine and metoprolol, Ativan by CIWA  5-Leukocytosis Lactic acidosis: Sepsis ruled out Thick acidosis likely related to hypovolemia and in the setting of alcohol use. Lactic  acid normalized. Leukocytosis in the setting of dehydration. Infection negative, UA negative, CT chest abdomen pelvis negative for infection.  Viral panel negative.  Blood cultures no growth to date. If  spike fever will consider IV antibiotics.  Hypokalemia: Replete IV  Transaminases:  in the setting of alcohol intake.  Trending down  Estimated body mass index is 33.64 kg/m as calculated from the following:   Height as of this encounter: 5\' 11"  (1.803 m).   Weight as of this encounter: 109.4 kg.   DVT prophylaxis: Lovenox Code Status: Full code Family Communication: Care discussed with wife who was at bedside Disposition Plan:  Status is: Inpatient Remains inpatient appropriate because: Management of gastritis,    Consultants:  None  Procedures:   Antimicrobials:    Subjective: He reported improvement of abdominal pain, he vomited twice this morning.  He feels a little bit better. He is determined to stop drinking alcohol/    Objective: Vitals:   08/10/22 0601 08/10/22 0743 08/10/22 0959 08/10/22 1147  BP: (!) 173/116 (!) 182/76 (!) 155/110 (!) 140/100  Pulse: (!) 129 (!) 130 (!) 134 (!) 116  Resp: 18 18    Temp: 99.5 F (37.5 C) 98.3 F (36.8 C)    TempSrc: Oral Oral    SpO2: 94% 94%    Weight:      Height:        Intake/Output Summary (Last 24 hours) at 08/10/2022 1437 Last data filed at 08/10/2022 1300 Gross per 24 hour  Intake 3120.11 ml  Output 2200 ml  Net 920.11 ml   Filed Weights   08/08/22 1421 08/09/22 0839  Weight: 88 kg 109.4 kg    Examination:  General exam: Appears calm and comfortable  Respiratory  system: Clear to auscultation. Respiratory effort normal. Cardiovascular system: S1 & S2 heard, RRR. No JVD, murmurs, rubs, gallops or clicks. No pedal edema. Gastrointestinal system: Abdomen is nondistended, soft and nontender. No organomegaly or masses felt. Normal bowel sounds heard. Central nervous system: Alert and oriented. No  focal neurological deficits. Extremities: Symmetric 5 x 5 power.   Data Reviewed: I have personally reviewed following labs and imaging studies  CBC: Recent Labs  Lab 08/08/22 1424 08/10/22 0341  WBC 21.7* 11.5*  HGB 18.0* 16.7  HCT 51.5 47.8  MCV 98.1 100.4*  PLT 226 086*   Basic Metabolic Panel: Recent Labs  Lab 08/08/22 1424 08/10/22 0341  NA 140 135  K 3.9 3.2*  CL 102 95*  CO2 23 26  GLUCOSE 123* 91  BUN 8 6  CREATININE 0.83 0.81  CALCIUM 9.5 9.1   GFR: Estimated Creatinine Clearance: 155.5 mL/min (by C-G formula based on SCr of 0.81 mg/dL). Liver Function Tests: Recent Labs  Lab 08/08/22 1424 08/10/22 0341  AST 106* 40  ALT 88* 48*  ALKPHOS 99 78  BILITOT 0.9 2.4*  PROT 7.9 6.5  ALBUMIN 4.2 3.1*   Recent Labs  Lab 08/08/22 1424  LIPASE 30   No results for input(s): "AMMONIA" in the last 168 hours. Coagulation Profile: Recent Labs  Lab 08/10/22 0341  INR 1.1   Cardiac Enzymes: No results for input(s): "CKTOTAL", "CKMB", "CKMBINDEX", "TROPONINI" in the last 168 hours. BNP (last 3 results) No results for input(s): "PROBNP" in the last 8760 hours. HbA1C: No results for input(s): "HGBA1C" in the last 72 hours. CBG: No results for input(s): "GLUCAP" in the last 168 hours. Lipid Profile: No results for input(s): "CHOL", "HDL", "LDLCALC", "TRIG", "CHOLHDL", "LDLDIRECT" in the last 72 hours. Thyroid Function Tests: No results for input(s): "TSH", "T4TOTAL", "FREET4", "T3FREE", "THYROIDAB" in the last 72 hours. Anemia Panel: No results for input(s): "VITAMINB12", "FOLATE", "FERRITIN", "TIBC", "IRON", "RETICCTPCT" in the last 72 hours. Sepsis Labs: Recent Labs  Lab 08/08/22 1737 08/08/22 1927  LATICACIDVEN 2.5* 1.3    Recent Results (from the past 240 hour(s))  Culture, blood (single)     Status: None (Preliminary result)   Collection Time: 08/08/22  5:23 PM   Specimen: BLOOD  Result Value Ref Range Status   Specimen Description   Final     BLOOD LEFT ANTECUBITAL Performed at [facility], [place] [address], [place], [place] [address]    Special Requests   Final    BOTTLES DRAWN AEROBIC AND ANAEROBIC Blood Culture adequate volume Performed at [facility], [address] [place], [place] [address]    Culture   Final    NO GROWTH 2 DAYS Performed at [facility], [place] [address] [place], [place] [address]    Report Status PENDING  Incomplete  Respiratory (~20 pathogens) panel by PCR     Status: None   Collection Time: 08/08/22  7:29 PM   Specimen: Nasopharyngeal Swab; Respiratory  Result Value Ref Range Status   Adenovirus NOT DETECTED NOT DETECTED Final   Coronavirus 229E NOT DETECTED NOT DETECTED Final    Comment: (NOTE) The Coronavirus on the Respiratory Panel, DOES NOT test for the novel  Coronavirus (2019 nCoV)    Coronavirus HKU1 NOT DETECTED NOT DETECTED Final   Coronavirus NL63 NOT DETECTED NOT DETECTED Final   Coronavirus OC43 NOT DETECTED NOT DETECTED Final   Metapneumovirus NOT DETECTED NOT DETECTED Final   Rhinovirus / Enterovirus NOT DETECTED NOT DETECTED  Final   Influenza A NOT DETECTED NOT DETECTED Final   Influenza B NOT DETECTED NOT DETECTED Final   Parainfluenza Virus 1 NOT DETECTED NOT DETECTED Final   Parainfluenza Virus 2 NOT DETECTED NOT DETECTED Final   Parainfluenza Virus 3 NOT DETECTED NOT DETECTED Final   Parainfluenza Virus 4 NOT DETECTED NOT DETECTED Final   Respiratory Syncytial Virus NOT DETECTED NOT DETECTED Final   Bordetella pertussis NOT DETECTED NOT DETECTED Final   Bordetella Parapertussis NOT DETECTED NOT DETECTED Final   Chlamydophila pneumoniae NOT DETECTED NOT DETECTED Final   Mycoplasma pneumoniae NOT DETECTED NOT DETECTED Final    Comment: Performed at [facility], [place] [address] [place], [place] [address]  Resp panel by RT-PCR (RSV, Flu A&B, Covid) Anterior Nasal Swab     Status: None   Collection Time: 08/08/22  7:29 PM    Specimen: Anterior Nasal Swab  Result Value Ref Range Status   SARS Coronavirus 2 by RT PCR NEGATIVE NEGATIVE Final    Comment: (NOTE) SARS-CoV-2 target nucleic acids are NOT DETECTED.  The SARS-CoV-2 RNA is generally detectable in upper respiratory specimens during the acute phase of infection. The lowest concentration of SARS-CoV-2 viral copies this assay can detect is 138 copies/mL. A negative result does not preclude SARS-Cov-2 infection and should not be used as the sole basis for treatment or other patient management decisions. A negative result may occur with  improper specimen collection/handling, submission of specimen other than nasopharyngeal swab, presence of viral mutation(s) within the areas targeted by this assay, and inadequate number of viral copies(<138 copies/mL). A negative result must be combined with clinical observations, patient history, and epidemiological information. The expected result is Negative.  Fact Sheet for Patients:  EntrepreneurPulse.com.au  Fact Sheet for Healthcare Providers:  IncredibleEmployment.be  This test is no t yet approved or cleared by the [place] FDA and  has been authorized for detection and/or diagnosis of SARS-CoV-2 by FDA under an Emergency Use Authorization (EUA). This EUA will remain  in effect (meaning this test can be used) for the duration of the COVID-19 declaration under Section 564(b)(1) of the Act, 21 U.S.C.section 360bbb-3(b)(1), unless the authorization is terminated  or revoked sooner.       Influenza A by PCR NEGATIVE NEGATIVE Final   Influenza B by PCR NEGATIVE NEGATIVE Final    Comment: (NOTE) The Xpert Xpress SARS-CoV-2/FLU/RSV plus assay is intended as an aid in the diagnosis of influenza from Nasopharyngeal swab specimens and should not be used as a sole basis for treatment. Nasal washings and aspirates are unacceptable for Xpert Xpress  SARS-CoV-2/FLU/RSV testing.  Fact Sheet for Patients: EntrepreneurPulse.com.au  Fact Sheet for Healthcare Providers: IncredibleEmployment.be  This test is not yet approved or cleared by the [place] FDA and has been authorized for detection and/or diagnosis of SARS-CoV-2 by FDA under an Emergency Use Authorization (EUA). This EUA will remain in effect (meaning this test can be used) for the duration of the COVID-19 declaration under Section 564(b)(1) of the Act, 21 U.S.C. section 360bbb-3(b)(1), unless the authorization is terminated or revoked.     Resp Syncytial Virus by PCR NEGATIVE NEGATIVE Final    Comment: (NOTE) Fact Sheet for Patients: EntrepreneurPulse.com.au  Fact Sheet for Healthcare Providers: IncredibleEmployment.be  This test is not yet approved or cleared by the [place] FDA and has been authorized for detection and/or diagnosis of SARS-CoV-2 by FDA under an Emergency Use Authorization (EUA). This EUA will remain in effect (meaning this  test can be used) for the duration of the COVID-19 declaration under Section 564(b)(1) of the Act, 21 U.S.C. section 360bbb-3(b)(1), unless the authorization is terminated or revoked.  Performed at [facility], [address] [place], [place] [address]          Radiology Studies: US Abdomen Limited RUQ (LIVER/GB)  Result Date: 08/08/2022 CLINICAL DATA:  151471 RUQ pain 151471 EXAM: ULTRASOUND ABDOMEN LIMITED RIGHT UPPER QUADRANT COMPARISON:  None Available. FINDINGS: Gallbladder: No gallstones, pericholecystic fluid or wall thickening visualized. Common bile duct: Not visualized due to bowel gas. Liver: Liver is hyperechoic consistent with fatty infiltration. No focal hepatic lesions identified. No intrahepatic ductal dilatation. IMPRESSION: Hepatic fatty infiltration. The evaluation was limited by suboptimal visualization.  CBD could not be seen. Electronically Signed   By: Layla Maw M.D.   On: 08/08/2022 20:04   CT Angio Chest/Abd/Pel for Dissection W and/or Wo Contrast  Result Date: 08/08/2022 CLINICAL DATA:  Aortic aneurysm suspected abdominal pain EXAM: CT ANGIOGRAPHY CHEST, ABDOMEN AND PELVIS TECHNIQUE: Non-contrast CT of the chest was initially obtained. Multidetector CT imaging through the chest, abdomen and pelvis was performed using the standard protocol during bolus administration of intravenous contrast. Multiplanar reconstructed images and MIPs were obtained and reviewed to evaluate the vascular anatomy. RADIATION DOSE REDUCTION: This exam was performed according to the departmental dose-optimization program which includes automated exposure control, adjustment of the mA and/or kV according to patient size and/or use of iterative reconstruction technique. CONTRAST:  OMNIPAQUE IOHEXOL 350 MG/ML SOLN COMPARISON:  None Available. FINDINGS: CTA CHEST FINDINGS Cardiovascular: Heart is normal size. Aorta is normal caliber. No evidence of aortic dissection. No visible pulmonary embolus. Mediastinum/Nodes: No mediastinal, hilar, or axillary adenopathy. Trachea and esophagus are unremarkable. Thyroid unremarkable. Small hiatal hernia. Lungs/Pleura: Lungs are clear. No focal airspace opacities or suspicious nodules. No effusions. Musculoskeletal: Chest wall soft tissues are unremarkable. No acute bony abnormality. Review of the MIP images confirms the above findings. CTA ABDOMEN AND PELVIS FINDINGS VASCULAR Aorta: Normal caliber aorta without aneurysm, dissection, vasculitis or significant stenosis. Celiac: Patent without evidence of aneurysm, dissection, vasculitis or significant stenosis. SMA: Patent without evidence of aneurysm, dissection, vasculitis or significant stenosis. Renals: Both renal arteries are patent without evidence of aneurysm, dissection, vasculitis, fibromuscular dysplasia or significant  stenosis. IMA: Patent without evidence of aneurysm, dissection, vasculitis or significant stenosis. Inflow: Patent without evidence of aneurysm, dissection, vasculitis or significant stenosis. Veins: No obvious venous abnormality within the limitations of this arterial phase study. Review of the MIP images confirms the above findings. NON-VASCULAR Hepatobiliary: Diffuse low-density throughout the liver compatible with fatty infiltration. No focal abnormality. Gallbladder unremarkable. Pancreas: No focal abnormality or ductal dilatation. Spleen: No focal abnormality.  Normal size. Adrenals/Urinary Tract: No adrenal abnormality. No focal renal abnormality. No stones or hydronephrosis. Urinary bladder is unremarkable. Stomach/Bowel: Normal appendix. Stomach, large and small bowel grossly unremarkable. Lymphatic: No adenopathy Reproductive: No visible focal abnormality. Other: No free fluid or free air. Musculoskeletal: No acute bony abnormality. Review of the MIP images confirms the above findings. IMPRESSION: No evidence of aortic aneurysm or dissection. No evidence of pulmonary embolus. No acute findings in the chest, abdomen or pelvis. Small hiatal hernia. Hepatic steatosis. Electronically Signed   By: Charlett Nose M.D.   On: 08/08/2022 18:16        Scheduled Meds:  docusate sodium  100 mg Oral BID   [START ON 08/11/2022] enoxaparin (LOVENOX) injection  50 mg Subcutaneous Daily   folic acid  1 mg Oral Daily   multivitamin with minerals  1 tablet Oral Daily   nicotine  14 mg Transdermal Daily   pantoprazole (PROTONIX) IV  40 mg Intravenous Q12H   sodium chloride flush  3 mL Intravenous Q12H   sucralfate  1 g Oral TID WC & HS   thiamine  100 mg Oral Daily   Or   thiamine  100 mg Intravenous Daily   Continuous Infusions:  lactated ringers 75 mL/hr at 08/10/22 0758     LOS: 0 days    Time spent: 35 minutes    Glover Capano A Niraj Kudrna, MD [facility]   If 7PM-7AM, please contact  night-coverage [email or web address]  08/10/2022, 2:37 PM

## 2022-08-10 NOTE — ED Provider Notes (Addendum)
Care of this patient was joined with the APP. I evaluated at bedside emergently as I was called to patient's bedside due to critical vital signs.  Patient was tachycardic, borderline febrile, hypertensive and diaphoretic. My evaluation as below. Clinical Course as of 08/10/22 1903  Sat Aug 08, 2022  1726 I was called emergently to patient's bedside for unstable vital signs.  Patient endorses a history of 8-10 drinks per day.  Endorses severe right upper quadrant pain that developed throughout the day today. [CC]  1727 Appears to have sepsis uncertain etiology with white count 21, tachycardia.  Appears diaphoretic. [CC]    Clinical Course User Index [CC] Tretha Sciara, MD   Ultimately substantially ill patient uncertain etiology.  Broad objective evaluation completed.  Multiple pain medications administered to control symptoms.  Objective imaging with no focal pathology.  Patient does report that he stepped on a nail last week.  Possible bloodstream infection from this source.  Arranged for admission at main campus.  Frequently reassessed the emergency department.   Tretha Sciara, MD 08/10/22 1905

## 2022-08-10 NOTE — Discharge Instructions (Signed)
Outpatient Providers   [facility] ([facility]) Group and individual counseling. [address]  [place], [place] [address] [phone] [place]: [phone]  [place]: [phone] Medicaid and uninsured.   [facility] groups multiple times per week. [place], [place], [place] [address] [phone] Takes Medicaid and other insurances.   [facility]  Chemical Dependency Intensive Outpatient Program (IOP) [address] [place], [place] [address] [phone] Takes Pharmacist, community and [place].   [facility]  IOP and Partial Hospitalization Program  [place].  Stephen Reid, Stephen [place] [address] [phone] Private Insurance, [place] only for partial hospitalization     [facility] ([place]) IOP, individual counseling, medication management Stephen [place], [place] [address] [phone] Medicaid and [facility]  Stephen Reid [address]  [place], [place] [address] [phone] Private Insurance and [facility] [address]  [place], [place] [address] [phone] Private Insurance, [place], and Self Pay   [facility]: [facility]adone Clinic  [place], Stephen [place] [address] [facility]  [address]  Stephen Reid, Stephen [place] [address] [phone]  [facility]  [address] [place], [place] [address] [phone]       Residential Treatment Programs  [facility] (Stephen Reid.) Stephen [place], [place] [address] [phone] or [phone] Detox and Residential Rehab 14 days (Medicare, Medicaid, private insurance, and self pay)  [facility] Treatment Services Stephen Reid, Stephen [place] [address] [phone] Detox (self Pay and Medicaid Limited availability) Rehab Only Male (Medicare, [place],  and Self Pay)  [facility] Stephen Reid [address] [place], [place] [address] [phone] or [phone] Private Insurance only  Stephen Reid  Stephen Reid.  [place], [place] [address] [phone] Treatment Only, must make assessment appointment, and must be sober for assessment appointment. Self pay, Medicare A and B, [facility], must be [facility] resident.      Residential Treatment [facility] , Stephen Reid  [phone] Stephen Reid, [place], [place]. They offer assistance with transportation.   [facility] [address] [place],  [place], [place] [address] [phone] [facility] No insurance     [place], [place] [address] [phone] No pending legal charges, Long-term work program  [place]: [facility]  [place], [place] [address] [address], [place], [place] [address] Residential treatment (takes people on Methadone/Suboxone)  Medicaid and uninsured  [facility]  [address] [place], [place] [address] [phone] or [phone] [employer] Insurance Only [place] [phone] Private Insurance Males/Females, call to make referrals, multiple facilities   [facility] [address]  [place], [place] [address]  [phone] Men Only Upfront Fee [facility] [address]      [facility] Women[facility]: [facility] [place], [place] [address] [phone]  [place], [place] [address] [phone] (f)  Stephen Reid Stephen Reid, [place] [address] [phone] (f)       Syringe Services Program Due to COVID-19, syringe services programs are likely operating under different hours with  limited or no fixed site hours. Some programs may not be operating at all. Please contact the program directly using the phone numbers provided below to see if they are still operating under COVID-19.  [facility] Solution to the Opioid Problem (GCSTOP) Fixed; mobile; peer-based Stephen Reid [phone] [email or web address] Fixed site exchange at [facility], [place]. [place], [place] [address] on Wednesdays (2:00 - 5:00 pm) and Thursdays (4:00 - 8:00 pm). Pop-up mobile exchange locations: [employer] [facility] [employer], [address] [place], Stephen [place] [address] on Tuesdays (11:00 am - 1:00 pm) and Fridays (11:00 am - 1:00 pm) [place] [address], [place], [place] [address] on Tuesdays (2:00 - 4:00 pm) and Fridays (2:00 - 4:00 pm) [place] [facility] - also serves [place] and [employer] [place] [employer] Fixed; mobile; peer-based Stephen Reid [phone] Stephen Reid [address] [place], [place] [address] Delivery and outreach available in [place] and [place], please call for more information. Monday, Tuesday: 1:00 -7:00 pm Thursday: 4:00 pm - 8:00 pm Friday: 1:00 pm - 8:00 pm)

## 2022-08-10 NOTE — TOC Progression Note (Signed)
Transition of Care [facility]) - Initial/Assessment Note    Patient Details  Name: Stephen Reid MRN: [id number] Date of Birth: 12/20/1985  Transition of [facility]) CM/SW Contact:    Milinda Antis, LCSWA Phone Number: 08/10/2022, 1:34 PM  Clinical Narrative:                  Transition of Care Department [facility]) has reviewed patient.  Patient from home with spouse admitted for abdominal pain with vomiting.  We will continue to monitor patient advancement through interdisciplinary progression rounds.  Substance use resources added to the AVS.    Patient Goals and CMS Choice            Expected Discharge Plan and Services                                              Prior Living Arrangements/Services                       Activities of Daily Living Home Assistive Devices/Equipment: None ADL Screening (condition at time of admission) Patient's cognitive ability adequate to safely complete daily activities?: Yes Is the patient deaf or have difficulty hearing?: No Does the patient have difficulty seeing, even when wearing glasses/contacts?: No Does the patient have difficulty concentrating, remembering, or making decisions?: No Patient able to express need for assistance with ADLs?: Yes Does the patient have difficulty dressing or bathing?: No Independently performs ADLs?: No Communication: Independent Dressing (OT): Independent Grooming: Independent Feeding: Independent Bathing: Independent Toileting: Independent In/Out Bed: Independent Walks in Home: Independent Does the patient have difficulty walking or climbing stairs?: No Weakness of Legs: None Weakness of Arms/Hands: None  Permission Sought/Granted                  Emotional Assessment              Admission diagnosis:  Abdominal pain with vomiting [R10.9, R11.10] Sepsis, due to unspecified organism, unspecified whether acute organ dysfunction present [facility]) [A41.9] Patient  Active Problem List   Diagnosis Date Noted   Abdominal pain with vomiting 08/09/2022   Alcohol dependence ([place]) 08/09/2022   Marijuana abuse 08/09/2022   Tobacco dependence 08/09/2022   PCP:  Pcp, No Pharmacy:   [id number] [place], [place] [address] Phone: [phone] Fax: [phone]  [id number] [place], [address]. [place] [address] Phone: [phone] Fax: [phone]  [place], [place] [address] Phone: [phone] Fax: [phone]     Social Determinants of Health (SDOH) Social History: SDOH Screenings   Tobacco Use: High Risk (08/09/2022)   SDOH Interventions:     Readmission Risk Interventions     No data to display

## 2022-08-10 NOTE — Progress Notes (Signed)
Pt. Vomited large amount of bile colored emesis after medication administration including Potassium, Ativan and Thiamine. MD aware and medications given IV per MD  Anastasio Auerbach

## 2022-08-11 ENCOUNTER — Inpatient Hospital Stay (HOSPITAL_COMMUNITY): Payer: 59

## 2022-08-11 DIAGNOSIS — R1110 Vomiting, unspecified: Secondary | ICD-10-CM | POA: Diagnosis not present

## 2022-08-11 DIAGNOSIS — R109 Unspecified abdominal pain: Secondary | ICD-10-CM | POA: Diagnosis not present

## 2022-08-11 LAB — BASIC METABOLIC PANEL
Anion gap: 11 (ref 5–15)
BUN: 9 mg/dL (ref 6–20)
CO2: 25 mmol/L (ref 22–32)
Calcium: 8.5 mg/dL — ABNORMAL LOW (ref 8.9–10.3)
Chloride: 96 mmol/L — ABNORMAL LOW (ref 98–111)
Creatinine, Ser: 0.91 mg/dL (ref 0.61–1.24)
GFR, Estimated: >60 mL/min (ref >60)
Glucose, Bld: 82 mg/dL (ref 70–99)
Potassium: 3 mmol/L — ABNORMAL LOW (ref 3.5–5.1)
Sodium: 132 mmol/L — ABNORMAL LOW (ref 135–145)

## 2022-08-11 LAB — CBC
HCT: 46.4 % (ref 39.0–52.0)
Hemoglobin: 16 g/dL (ref 13.0–17.0)
MCH: 34.7 pg — ABNORMAL HIGH (ref 26.0–34.0)
MCHC: 34.5 g/dL (ref 30.0–36.0)
MCV: 100.7 fL — ABNORMAL HIGH (ref 80.0–100.0)
Platelets: 144 10*3/uL — ABNORMAL LOW (ref 150–400)
RBC: 4.61 MIL/uL (ref 4.22–5.81)
RDW: 12.6 % (ref 11.5–15.5)
WBC: 14.5 10*3/uL — ABNORMAL HIGH (ref 4.0–10.5)
nRBC: 0 % (ref 0.0–0.2)

## 2022-08-11 LAB — PROCALCITONIN: Procalcitonin: 0.1 ng/mL

## 2022-08-11 LAB — MAGNESIUM: Magnesium: 1.4 mg/dL — ABNORMAL LOW (ref 1.7–2.4)

## 2022-08-11 MED ORDER — AMLODIPINE BESYLATE 5 MG PO TABS
5.0000 mg | ORAL_TABLET | Freq: Every day | ORAL | Status: DC
  Administered 2022-08-11 – 2022-08-12 (×2): 5 mg via ORAL
  Filled 2022-08-11 (×2): qty 1

## 2022-08-11 MED ORDER — MAGNESIUM SULFATE 2 GM/50ML IV SOLN
2.0000 g | Freq: Once | INTRAVENOUS | Status: AC
  Administered 2022-08-11: 2 g via INTRAVENOUS
  Filled 2022-08-11: qty 50

## 2022-08-11 MED ORDER — POTASSIUM CHLORIDE 10 MEQ/100ML IV SOLN
10.0000 meq | INTRAVENOUS | Status: AC
  Administered 2022-08-11 (×3): 10 meq via INTRAVENOUS
  Filled 2022-08-11 (×3): qty 100

## 2022-08-11 MED ORDER — MAGNESIUM OXIDE -MG SUPPLEMENT 400 (240 MG) MG PO TABS
200.0000 mg | ORAL_TABLET | Freq: Two times a day (BID) | ORAL | Status: DC
  Administered 2022-08-11 – 2022-08-12 (×2): 200 mg via ORAL
  Filled 2022-08-11 (×2): qty 1

## 2022-08-11 MED ORDER — SODIUM CHLORIDE 0.9 % IV SOLN
3.0000 g | Freq: Four times a day (QID) | INTRAVENOUS | Status: DC
  Administered 2022-08-11 – 2022-08-12 (×3): 3 g via INTRAVENOUS
  Filled 2022-08-11 (×4): qty 8

## 2022-08-11 MED ORDER — POTASSIUM CHLORIDE 10 MEQ/100ML IV SOLN
10.0000 meq | INTRAVENOUS | Status: AC
  Administered 2022-08-11: 10 meq via INTRAVENOUS
  Filled 2022-08-11: qty 100

## 2022-08-11 MED ORDER — POTASSIUM CHLORIDE CRYS ER 20 MEQ PO TBCR
40.0000 meq | EXTENDED_RELEASE_TABLET | Freq: Once | ORAL | Status: AC
  Administered 2022-08-11: 40 meq via ORAL
  Filled 2022-08-11: qty 2

## 2022-08-11 NOTE — Progress Notes (Signed)
Pharmacy Antibiotic Note  Stephen Reid is a 37 y.o. male admitted on 08/08/2022 with pneumonia.  Pharmacy has been consulted for Unasyn dosing.  Plan: Unasyn 3g IV Q6h Trend WBC, fever, renal function F/u cultures, clinical progress, levels as indicated De-escalate when able   Height: 5\' 11"  (180.3 cm) Weight: 109.4 kg (241 lb 2.9 oz) IBW/kg (Calculated) : 75.3  Temp (24hrs), Avg:99.3 F (37.4 C), Min:98.6 F (37 C), Max:100 F (37.8 C)  Recent Labs  Lab 08/08/22 1424 08/08/22 1737 08/08/22 1927 08/10/22 0341 08/11/22 0403  WBC 21.7*  --   --  11.5* 14.5*  CREATININE 0.83  --   --  0.81 0.91  LATICACIDVEN  --  2.5* 1.3  --   --     Estimated Creatinine Clearance: 138.4 mL/min (by C-G formula based on SCr of 0.91 mg/dL).    No Known Allergies  Antimicrobials this admission: Cefepime,Vancomycin x1 1/13   Thank you for allowing pharmacy to be a part of this patient's care.  Ardyth Harps, PharmD Clinical Pharmacist

## 2022-08-11 NOTE — Progress Notes (Signed)
PROGRESS NOTE    Stephen Reid  NFA:[id number] DOB: [birth year] DOA: 08/08/2022 PCP: Pcp, No   Brief Narrative: 37 year old with past medical history significant for alcohol dependence presented with abdominal pain.  Patient developed nausea vomiting severe abdominal pain.  The morning prior to admission.  Reported midepigastric radiating right lower quadrant pain.  Worse with movement.  He drinks 4-5 cocktails daily.  Evaluation in the ED he was found to be tachycardic heart rate 120, leukocytosis white count 21, transaminases, normal lipase, lactic acid 2.5.  CT chest abdomen and pelvis no acute abnormality.  Patient admitted with gastritis, mild hepatitis from alcohol and alcohol withdrawal.  Treating for pneumonia.     Assessment & Plan:   Principal Problem:   Abdominal pain with vomiting Active Problems:   Alcohol dependence (HCC)   Marijuana abuse   Tobacco dependence   1-Abdominal Pain: Gastritis.  -Patient presented with abdominal pain, nausea vomiting.  Imaging unremarkable.  Transaminases Suspect related to alcoholic gastritis and or hepatitis -Continue with  PPI  -CT angio chest abdomen and pelvis: No evidence of aortic aneurysm.  No evidence of PE.  No acute finding chest abdomen and pelvis. -Right upper quadrant ultrasound; hepatic fatty liver.  Gallbladder no gallstones or pericholecystic fluid.  Common bile duct not visualized due to bowel gas. Abdominal pain resolved, plan to advance diet and monitor.  2- EtOH dependence: Now with alcohol withdrawal Continue with CIWA protocol, thiamine folic acid and Ativan SW consulted for substance abuse counseling Could Consider naltrexone at discharge  3-tobacco dependence: Encourage cessation  4-Hypertension, tachycardia could be related to alcohol withdrawal: As needed hydralazine and metoprolol, Ativan by CIWA BP elevated, plan to start Norvasc.   5-Leukocytosis Lactic acidosis: Sepsis ruled out Lactic  acidosis  likely related to hypovolemia and in the setting of alcohol use. Lactic acid normalized. Leukocytosis in the setting of dehydration. Infection negative, UA negative, CT chest abdomen pelvis negative for infection.  Viral panel negative.  Blood cultures no growth to date.   6-PNA: patient with low grade fever T 100. Report cough, chest x ray with mild hazy right retrocardiac opacity, could be related to atelectasis or pneumonia. White blood cell increased today, had a low-grade fever last night.  Plan to start IV Unasyn.  Will treat for 5 days. He can be discharge on Augmentin.   Hypokalemia: Replete IV and orally.  Hypomagnesemia; replete IV and orally.   Transaminases:  in the setting of alcohol intake.  Trending down  Estimated body mass index is 33.64 kg/m as calculated from the following:   Height as of this encounter: 5\' 11"  (1.803 m).   Weight as of this encounter: 109.4 kg.   DVT prophylaxis: Lovenox Code Status: Full code Family Communication: Care discussed with wife who was at bedside Disposition Plan:  Status is: Inpatient Remains inpatient appropriate because: Management of gastritis, Alcohol withdrawal.     Consultants:  None  Procedures:  [place]  Antimicrobials:    Subjective: He is feeling better no significant tremors today.  He report cough.  Reports resolution of abdominal pain. Determined to stop drinking alcohol   Objective: Vitals:   08/10/22 1713 08/10/22 2058 08/11/22 0624 08/11/22 0835  BP: (!) 159/101 (!) 143/106 (!) 153/110 (!) 147/113  Pulse: (!) 109 (!) 117 (!) 101 (!) 117  Resp: 18 18 18    Temp: 100 F (37.8 C) 99.3 F (37.4 C) 98.6 F (37 C)   TempSrc: Oral Oral Oral   SpO2: 95% 94%  96%   Weight:      Height:        Intake/Output Summary (Last 24 hours) at 08/11/2022 1046 Last data filed at 08/11/2022 0900 Gross per 24 hour  Intake 2904.15 ml  Output 250 ml  Net 2654.15 ml    Filed Weights   08/08/22 1421 08/09/22 0839   Weight: 88 kg 109.4 kg    Examination:  General exam: NAD Respiratory system: CTA Cardiovascular system: S 1, S 2 RRR Gastrointestinal system: BS present, soft, nt    Data Reviewed: I have personally reviewed following labs and imaging studies  CBC: Recent Labs  Lab 08/08/22 1424 08/10/22 0341 08/11/22 0403  WBC 21.7* 11.5* 14.5*  HGB 18.0* 16.7 16.0  HCT 51.5 47.8 46.4  MCV 98.1 100.4* 100.7*  PLT 226 149* 144*    Basic Metabolic Panel: Recent Labs  Lab 08/08/22 1424 08/10/22 0341 08/11/22 0403  NA 140 135 132*  K 3.9 3.2* 3.0*  CL 102 95* 96*  CO2 23 26 25   GLUCOSE 123* 91 82  BUN 8 6 9   CREATININE 0.83 0.81 0.91  CALCIUM 9.5 9.1 8.5*  MG  --   --  1.4*    GFR: Estimated Creatinine Clearance: 138.4 mL/min (by C-G formula based on SCr of 0.91 mg/dL). Liver Function Tests: Recent Labs  Lab 08/08/22 1424 08/10/22 0341  AST 106* 40  ALT 88* 48*  ALKPHOS 99 78  BILITOT 0.9 2.4*  PROT 7.9 6.5  ALBUMIN 4.2 3.1*    Recent Labs  Lab 08/08/22 1424  LIPASE 30    No results for input(s): "AMMONIA" in the last 168 hours. Coagulation Profile: Recent Labs  Lab 08/10/22 0341  INR 1.1    Cardiac Enzymes: No results for input(s): "CKTOTAL", "CKMB", "CKMBINDEX", "TROPONINI" in the last 168 hours. BNP (last 3 results) No results for input(s): "PROBNP" in the last 8760 hours. HbA1C: No results for input(s): "HGBA1C" in the last 72 hours. CBG: No results for input(s): "GLUCAP" in the last 168 hours. Lipid Profile: No results for input(s): "CHOL", "HDL", "LDLCALC", "TRIG", "CHOLHDL", "LDLDIRECT" in the last 72 hours. Thyroid Function Tests: No results for input(s): "TSH", "T4TOTAL", "FREET4", "T3FREE", "THYROIDAB" in the last 72 hours. Anemia Panel: No results for input(s): "VITAMINB12", "FOLATE", "FERRITIN", "TIBC", "IRON", "RETICCTPCT" in the last 72 hours. Sepsis Labs: Recent Labs  Lab 08/08/22 1737 08/08/22 1927 08/11/22 0403  PROCALCITON   --   --  0.10  LATICACIDVEN 2.5* 1.3  --      Recent Results (from the past 240 hour(s))  Culture, blood (single)     Status: None (Preliminary result)   Collection Time: 08/08/22  5:23 PM   Specimen: BLOOD  Result Value Ref Range Status   Specimen Description   Final    BLOOD LEFT ANTECUBITAL Performed at [facility], [address] [place], [address] [place]    Special Requests   Final    BOTTLES DRAWN AEROBIC AND ANAEROBIC Blood Culture adequate volume Performed at [facility], [address] [place], [address] [place]    Culture   Final    NO GROWTH 2 DAYS Performed at [facility], [address], [place], [address] [place]    Report Status PENDING  Incomplete  Respiratory (~20 pathogens) panel by PCR     Status: None   Collection Time: 08/08/22  7:29 PM   Specimen: Nasopharyngeal Swab; Respiratory  Result Value Ref Range Status   Adenovirus NOT DETECTED  NOT DETECTED Final   Coronavirus 229E NOT DETECTED NOT DETECTED Final    Comment: (NOTE) The Coronavirus on the Respiratory Panel, DOES NOT test for the novel  Coronavirus (2019 nCoV)    Coronavirus HKU1 NOT DETECTED NOT DETECTED Final   Coronavirus NL63 NOT DETECTED NOT DETECTED Final   Coronavirus OC43 NOT DETECTED NOT DETECTED Final   Metapneumovirus NOT DETECTED NOT DETECTED Final   Rhinovirus / Enterovirus NOT DETECTED NOT DETECTED Final   Influenza A NOT DETECTED NOT DETECTED Final   Influenza B NOT DETECTED NOT DETECTED Final   Parainfluenza Virus 1 NOT DETECTED NOT DETECTED Final   Parainfluenza Virus 2 NOT DETECTED NOT DETECTED Final   Parainfluenza Virus 3 NOT DETECTED NOT DETECTED Final   Parainfluenza Virus 4 NOT DETECTED NOT DETECTED Final   Respiratory Syncytial Virus NOT DETECTED NOT DETECTED Final   Bordetella pertussis NOT DETECTED NOT DETECTED Final   Bordetella Parapertussis NOT DETECTED NOT DETECTED Final   Chlamydophila pneumoniae NOT DETECTED NOT DETECTED  Final   Mycoplasma pneumoniae NOT DETECTED NOT DETECTED Final    Comment: Performed at [facility], [place] [address] [place], [place] [address]  Resp panel by RT-PCR (RSV, Flu A&B, Covid) Anterior Nasal Swab     Status: None   Collection Time: 08/08/22  7:29 PM   Specimen: Anterior Nasal Swab  Result Value Ref Range Status   SARS Coronavirus 2 by RT PCR NEGATIVE NEGATIVE Final    Comment: (NOTE) SARS-CoV-2 target nucleic acids are NOT DETECTED.  The SARS-CoV-2 RNA is generally detectable in upper respiratory specimens during the acute phase of infection. The lowest concentration of SARS-CoV-2 viral copies this assay can detect is 138 copies/mL. A negative result does not preclude SARS-Cov-2 infection and should not be used as the sole basis for treatment or other patient management decisions. A negative result may occur with  improper specimen collection/handling, submission of specimen other than nasopharyngeal swab, presence of viral mutation(s) within the areas targeted by this assay, and inadequate number of viral copies(<138 copies/mL). A negative result must be combined with clinical observations, patient history, and epidemiological information. The expected result is Negative.  Fact Sheet for Patients:  EntrepreneurPulse.com.au  Fact Sheet for Healthcare Providers:  IncredibleEmployment.be  This test is no t yet approved or cleared by the [place] FDA and  has been authorized for detection and/or diagnosis of SARS-CoV-2 by FDA under an Emergency Use Authorization (EUA). This EUA will remain  in effect (meaning this test can be used) for the duration of the COVID-19 declaration under Section 564(b)(1) of the Act, 21 U.S.C.section 360bbb-3(b)(1), unless the authorization is terminated  or revoked sooner.       Influenza A by PCR NEGATIVE NEGATIVE Final   Influenza B by PCR NEGATIVE NEGATIVE Final    Comment: (NOTE) The  Xpert Xpress SARS-CoV-2/FLU/RSV plus assay is intended as an aid in the diagnosis of influenza from Nasopharyngeal swab specimens and should not be used as a sole basis for treatment. Nasal washings and aspirates are unacceptable for Xpert Xpress SARS-CoV-2/FLU/RSV testing.  Fact Sheet for Patients: EntrepreneurPulse.com.au  Fact Sheet for Healthcare Providers: IncredibleEmployment.be  This test is not yet approved or cleared by the [place] FDA and has been authorized for detection and/or diagnosis of SARS-CoV-2 by FDA under an Emergency Use Authorization (EUA). This EUA will remain in effect (meaning this test can be used) for the duration of the COVID-19 declaration under Section 564(b)(1) of the Act, 21 U.S.C.  section 360bbb-3(b)(1), unless the authorization is terminated or revoked.     Resp Syncytial Virus by PCR NEGATIVE NEGATIVE Final    Comment: (NOTE) Fact Sheet for Patients: EntrepreneurPulse.com.au  Fact Sheet for Healthcare Providers: IncredibleEmployment.be  This test is not yet approved or cleared by the [place] FDA and has been authorized for detection and/or diagnosis of SARS-CoV-2 by FDA under an Emergency Use Authorization (EUA). This EUA will remain in effect (meaning this test can be used) for the duration of the COVID-19 declaration under Section 564(b)(1) of the Act, 21 U.S.C. section 360bbb-3(b)(1), unless the authorization is terminated or revoked.  Performed at [facility], [address], [place], [place] [address]          Radiology Studies: DG CHEST PORT 1 VIEW  Result Date: 08/11/2022 CLINICAL DATA:  [id number] Leukocytosis 100030 EXAM: PORTABLE CHEST 1 VIEW COMPARISON:  08/08/2022 chest CT angiogram FINDINGS: Normal heart size. Normal mediastinal contour. No pneumothorax. No pleural effusion. Minimal hazy right retrocardiac opacity obscuring  the far medial right hemidiaphragm. No pulmonary edema. IMPRESSION: Minimal hazy right retrocardiac opacity, differential includes mild atelectasis or pneumonia. Chest radiograph follow-up advised. Electronically Signed   By: Ilona Sorrel M.D.   On: 08/11/2022 08:49        Scheduled Meds:  amLODipine  5 mg Oral Daily   docusate sodium  100 mg Oral BID   enoxaparin (LOVENOX) injection  50 mg Subcutaneous Daily   folic acid  1 mg Oral Daily   magnesium oxide  200 mg Oral BID   multivitamin with minerals  1 tablet Oral Daily   nicotine  14 mg Transdermal Daily   pantoprazole (PROTONIX) IV  40 mg Intravenous Q12H   potassium chloride  40 mEq Oral Once   sodium chloride flush  3 mL Intravenous Q12H   sucralfate  1 g Oral TID WC & HS   thiamine  100 mg Oral Daily   Or   thiamine  100 mg Intravenous Daily   Continuous Infusions:  lactated ringers 75 mL/hr at 08/10/22 0758   potassium chloride 10 mEq (08/11/22 0950)     LOS: 1 day    Time spent: 35 minutes    Jahzara Slattery A Haliegh Khurana, MD [facility]   If 7PM-7AM, please contact night-coverage [email or web address]  08/11/2022, 10:46 AM

## 2022-08-12 DIAGNOSIS — R109 Unspecified abdominal pain: Secondary | ICD-10-CM | POA: Diagnosis not present

## 2022-08-12 DIAGNOSIS — R1110 Vomiting, unspecified: Secondary | ICD-10-CM | POA: Diagnosis not present

## 2022-08-12 LAB — COMPREHENSIVE METABOLIC PANEL
ALT: 35 U/L (ref 0–44)
AST: 33 U/L (ref 15–41)
Albumin: 3.1 g/dL — ABNORMAL LOW (ref 3.5–5.0)
Alkaline Phosphatase: 73 U/L (ref 38–126)
Anion gap: 14 (ref 5–15)
BUN: 10 mg/dL (ref 6–20)
CO2: 21 mmol/L — ABNORMAL LOW (ref 22–32)
Calcium: 8.8 mg/dL — ABNORMAL LOW (ref 8.9–10.3)
Chloride: 100 mmol/L (ref 98–111)
Creatinine, Ser: 1.04 mg/dL (ref 0.61–1.24)
GFR, Estimated: >60 mL/min (ref >60)
Glucose, Bld: 97 mg/dL (ref 70–99)
Potassium: 3.1 mmol/L — ABNORMAL LOW (ref 3.5–5.1)
Sodium: 135 mmol/L (ref 135–145)
Total Bilirubin: 1.7 mg/dL — ABNORMAL HIGH (ref 0.3–1.2)
Total Protein: 7 g/dL (ref 6.5–8.1)

## 2022-08-12 LAB — CBC
HCT: 49.6 % (ref 39.0–52.0)
Hemoglobin: 17.3 g/dL — ABNORMAL HIGH (ref 13.0–17.0)
MCH: 34.9 pg — ABNORMAL HIGH (ref 26.0–34.0)
MCHC: 34.9 g/dL (ref 30.0–36.0)
MCV: 100.2 fL — ABNORMAL HIGH (ref 80.0–100.0)
Platelets: 202 10*3/uL (ref 150–400)
RBC: 4.95 MIL/uL (ref 4.22–5.81)
RDW: 12.6 % (ref 11.5–15.5)
WBC: 8.8 10*3/uL (ref 4.0–10.5)
nRBC: 0 % (ref 0.0–0.2)

## 2022-08-12 LAB — MAGNESIUM: Magnesium: 1.6 mg/dL — ABNORMAL LOW (ref 1.7–2.4)

## 2022-08-12 MED ORDER — SUCRALFATE 1 G PO TABS: 1.0000 g | ORAL_TABLET | Freq: Three times a day (TID) | ORAL | 0 refills | Status: DC

## 2022-08-12 MED ORDER — PANTOPRAZOLE SODIUM 40 MG PO TBEC: 40.0000 mg | DELAYED_RELEASE_TABLET | Freq: Every day | ORAL | 2 refills | Status: DC

## 2022-08-12 MED ORDER — AMOXICILLIN-POT CLAVULANATE 875-125 MG PO TABS
1.0000 | ORAL_TABLET | Freq: Two times a day (BID) | ORAL | Status: DC
  Administered 2022-08-12: 1 via ORAL
  Filled 2022-08-12: qty 1

## 2022-08-12 MED ORDER — AMLODIPINE BESYLATE 5 MG PO TABS: 5.0000 mg | ORAL_TABLET | Freq: Every day | ORAL | 2 refills | Status: DC

## 2022-08-12 MED ORDER — AMOXICILLIN-POT CLAVULANATE 875-125 MG PO TABS: 1.0000 | ORAL_TABLET | Freq: Two times a day (BID) | ORAL | 0 refills | Status: AC

## 2022-08-12 NOTE — Plan of Care (Signed)
Pt. A&OX4, no s/s of pain or N/V. AVS reviewed IV discontinued. All belongings returned. No further questions at this time. BP (!) 136/93 (BP Location: Left Arm)   Pulse (!) 101   Temp 98.8 F (37.1 C) (Oral)   Resp 20   Ht 5\' 11"  (1.803 m)   Wt 109.4 kg   SpO2 96%   BMI 33.64 kg/m  Louanne Skye 08/12/22 10:37 AM

## 2022-08-12 NOTE — Discharge Summary (Signed)
Physician Discharge Summary  Stephen Reid ZOX:[id number] DOB: 04/17/1986 DOA: 08/08/2022  PCP: Oneita Hurt, No  Admit date: 08/08/2022 Discharge date: 08/12/2022  Admitted From: Home Disposition: Home  Recommendations for Outpatient Follow-up:  Follow up with PCP in 1-2 weeks Started on amlodipine 5 minutes p.o. daily for elevated blood pressure Continue Augmentin to complete 5-day course for aspiration pneumonia Started on Protonix for gastritis Continue to encourage alcohol/tobacco cessation  Home Health: No Equipment/Devices: None  Discharge Condition: Stable CODE STATUS: Full code Diet recommendation: Heart healthy diet  History of present illness:  Stephen Reid is a 37 year old male with past medical history significant for alcohol/tobacco dependence who presented to [facility] ED on 1/13 with abdominal pain.  Patient reported mid epigastric pain radiating to his right lower quadrant that was worse with movements.  He endorses drinking 4-5 cocktails daily.  Evaluation in the ED he was noted be tachycardic with heart rate 120, elevated WBC count of 21, elevated LFTs, normal lipase with a lactic acid of 2.5.  CT angiogram chest/abdomen/pelvis with no acute abnormality.  Patient was admitted to the hospital service for gastritis, mild hepatitis from alcohol, alcohol withdrawal syndrome and aspiration pneumonia.  Hospital course:  Aspiration pneumonia Patient with low-grade fever, cough and chest x-ray with mild hazy right retrocardiac opacity.  Patient was initially started on IV Unasyn and will transition to Augmentin on discharge to complete 5-day course.  WBC count improved to 8.8 at time of discharge.  EtOH withdrawal Alcohol dependence Patient was noted to be in mild alcohol withdrawal during the hospitalization and monitored under CIWA protocol.  Counseled on need for complete cessation.  Social work consulted with resources given the patient at time of discharge.  Tobacco use  disorder Counseled on need for complete cessation.  Abdominal pain/gastritis Patient initially presenting with abdominal pain, localized to the epigastric region with radiation to the right lower quadrant.  CT angiogram chest/abdomen/pelvis with no acute findings.  Etiology likely secondary to gastritis from underlying alcohol abuse.  Started on IV Protonix with resolution of symptoms.  Will continue Protonix 40 mg p.o. daily and Carafate 1 g p.o. 3 times daily AC/at bedtime x 1 month.  Alcohol cessation.  Essential hypertension Blood pressure remained elevated during hospitalization, patient was started on amlodipine 5 mg p.o. daily.  Outpatient follow-up with PCP.  Hypokalemia Hypomagnesemia Repleted during hospitalization  Elevated LFTs Etiology secondary to alcohol abuse.  LFTs were monitored and trended down during hospitalization.  Continue to discuss alcohol cessation.  Discharge Diagnoses:  Principal Problem:   Abdominal pain with vomiting Active Problems:   Alcohol dependence (HCC)   Marijuana abuse   Tobacco dependence    Discharge Instructions  Discharge Instructions     Call MD for:  difficulty breathing, headache or visual disturbances   Complete by: As directed    Call MD for:  extreme fatigue   Complete by: As directed    Call MD for:  persistant dizziness or light-headedness   Complete by: As directed    Call MD for:  persistant nausea and vomiting   Complete by: As directed    Call MD for:  severe uncontrolled pain   Complete by: As directed    Call MD for:  temperature >100.4   Complete by: As directed    Diet - low sodium heart healthy   Complete by: As directed    Increase activity slowly   Complete by: As directed       Allergies as of  08/12/2022   No Known Allergies      Medication List     TAKE these medications    amLODipine 5 MG tablet Commonly known as: NORVASC Take 1 tablet (5 mg total) by mouth daily. Start taking on: August 13, 2022   amoxicillin-clavulanate 875-125 MG tablet Commonly known as: AUGMENTIN Take 1 tablet by mouth every 12 (twelve) hours for 5 days.   aspirin 325 MG tablet Take 325 mg by mouth every 6 (six) hours as needed for moderate pain.   ibuprofen 200 MG tablet Commonly known as: ADVIL Take 800 mg by mouth in the morning.   MULTIVITAMIN PO Take 1 tablet by mouth daily.   pantoprazole 40 MG tablet Commonly known as: Protonix Take 1 tablet (40 mg total) by mouth daily.   sucralfate 1 g tablet Commonly known as: Carafate Take 1 tablet (1 g total) by mouth 4 (four) times daily -  with meals and at bedtime.        No Known Allergies  Consultations: None   Procedures/Studies: DG CHEST PORT 1 VIEW  Result Date: 08/11/2022 CLINICAL DATA:  100030 Leukocytosis 100030 EXAM: PORTABLE CHEST 1 VIEW COMPARISON:  08/08/2022 chest CT angiogram FINDINGS: Normal heart size. Normal mediastinal contour. No pneumothorax. No pleural effusion. Minimal hazy right retrocardiac opacity obscuring the far medial right hemidiaphragm. No pulmonary edema. IMPRESSION: Minimal hazy right retrocardiac opacity, differential includes mild atelectasis or pneumonia. Chest radiograph follow-up advised. Electronically Signed   By: Delbert Phenix M.D.   On: 08/11/2022 08:49   US Abdomen Limited RUQ (LIVER/GB)  Result Date: 08/08/2022 CLINICAL DATA:  151471 RUQ pain 151471 EXAM: ULTRASOUND ABDOMEN LIMITED RIGHT UPPER QUADRANT COMPARISON:  None Available. FINDINGS: Gallbladder: No gallstones, pericholecystic fluid or wall thickening visualized. Common bile duct: Not visualized due to bowel gas. Liver: Liver is hyperechoic consistent with fatty infiltration. No focal hepatic lesions identified. No intrahepatic ductal dilatation. IMPRESSION: Hepatic fatty infiltration. The evaluation was limited by suboptimal visualization. CBD could not be seen. Electronically Signed   By: Layla Maw M.D.   On: 08/08/2022 20:04    CT Angio Chest/Abd/Pel for Dissection W and/or Wo Contrast  Result Date: 08/08/2022 CLINICAL DATA:  Aortic aneurysm suspected abdominal pain EXAM: CT ANGIOGRAPHY CHEST, ABDOMEN AND PELVIS TECHNIQUE: Non-contrast CT of the chest was initially obtained. Multidetector CT imaging through the chest, abdomen and pelvis was performed using the standard protocol during bolus administration of intravenous contrast. Multiplanar reconstructed images and MIPs were obtained and reviewed to evaluate the vascular anatomy. RADIATION DOSE REDUCTION: This exam was performed according to the departmental dose-optimization program which includes automated exposure control, adjustment of the mA and/or kV according to patient size and/or use of iterative reconstruction technique. CONTRAST:  OMNIPAQUE IOHEXOL 350 MG/ML SOLN COMPARISON:  None Available. FINDINGS: CTA CHEST FINDINGS Cardiovascular: Heart is normal size. Aorta is normal caliber. No evidence of aortic dissection. No visible pulmonary embolus. Mediastinum/Nodes: No mediastinal, hilar, or axillary adenopathy. Trachea and esophagus are unremarkable. Thyroid unremarkable. Small hiatal hernia. Lungs/Pleura: Lungs are clear. No focal airspace opacities or suspicious nodules. No effusions. Musculoskeletal: Chest wall soft tissues are unremarkable. No acute bony abnormality. Review of the MIP images confirms the above findings. CTA ABDOMEN AND PELVIS FINDINGS VASCULAR Aorta: Normal caliber aorta without aneurysm, dissection, vasculitis or significant stenosis. Celiac: Patent without evidence of aneurysm, dissection, vasculitis or significant stenosis. SMA: Patent without evidence of aneurysm, dissection, vasculitis or significant stenosis. Renals: Both renal arteries are patent without evidence of aneurysm,  dissection, vasculitis, fibromuscular dysplasia or significant stenosis. IMA: Patent without evidence of aneurysm, dissection, vasculitis or significant stenosis.  Inflow: Patent without evidence of aneurysm, dissection, vasculitis or significant stenosis. Veins: No obvious venous abnormality within the limitations of this arterial phase study. Review of the MIP images confirms the above findings. NON-VASCULAR Hepatobiliary: Diffuse low-density throughout the liver compatible with fatty infiltration. No focal abnormality. Gallbladder unremarkable. Pancreas: No focal abnormality or ductal dilatation. Spleen: No focal abnormality.  Normal size. Adrenals/Urinary Tract: No adrenal abnormality. No focal renal abnormality. No stones or hydronephrosis. Urinary bladder is unremarkable. Stomach/Bowel: Normal appendix. Stomach, large and small bowel grossly unremarkable. Lymphatic: No adenopathy Reproductive: No visible focal abnormality. Other: No free fluid or free air. Musculoskeletal: No acute bony abnormality. Review of the MIP images confirms the above findings. IMPRESSION: No evidence of aortic aneurysm or dissection. No evidence of pulmonary embolus. No acute findings in the chest, abdomen or pelvis. Small hiatal hernia. Hepatic steatosis. Electronically Signed   By: Rolm Baptise M.D.   On: 08/08/2022 18:16     Subjective:   Discharge Exam: Vitals:   08/12/22 0516 08/12/22 0854  BP: (!) 148/105 (!) 136/93  Pulse: (!) 102 (!) 101  Resp: 18 20  Temp: 98.3 F (36.8 C) 98.8 F (37.1 C)  SpO2: 98% 96%   Vitals:   08/11/22 1406 08/11/22 2120 08/12/22 0516 08/12/22 0854  BP: (!) 151/110 (!) 145/94 (!) 148/105 (!) 136/93  Pulse: (!) 107 (!) 102 (!) 102 (!) 101  Resp:  18 18 20   Temp:  98.4 F (36.9 C) 98.3 F (36.8 C) 98.8 F (37.1 C)  TempSrc:  Oral Oral Oral  SpO2: 97% 95% 98% 96%  Weight:      Height:        Physical Exam: GEN: NAD, alert and oriented x 3, wd/wn HEENT: NCAT, PERRL, EOMI, sclera clear, MMM PULM: CTAB w/o wheezes/crackles, normal respiratory effort, on room air CV: RRR w/o M/G/R GI: abd soft, NTND, NABS, no R/G/M MSK: no peripheral  edema, muscle strength globally intact 5/5 bilateral upper/lower extremities NEURO: CN II-XII intact, no focal deficits, sensation to light touch intact PSYCH: normal mood/affect Integumentary: dry/intact, no rashes or wounds    The results of significant diagnostics from this hospitalization (including imaging, microbiology, ancillary and laboratory) are listed below for reference.     Microbiology: Recent Results (from the past 240 hour(s))  Culture, blood (single)     Status: None (Preliminary result)   Collection Time: 08/08/22  5:23 PM   Specimen: BLOOD  Result Value Ref Range Status   Specimen Description   Final    BLOOD LEFT ANTECUBITAL Performed at [facility], [address] [place], [place] [address]    Special Requests   Final    BOTTLES DRAWN AEROBIC AND ANAEROBIC Blood Culture adequate volume Performed at [facility], [address], [place], [place] [address]    Culture   Final    NO GROWTH 3 DAYS Performed at [facility], [place] [address] [place], [place] [address]    Report Status PENDING  Incomplete  Respiratory (~20 pathogens) panel by PCR     Status: None   Collection Time: 08/08/22  7:29 PM   Specimen: Nasopharyngeal Swab; Respiratory  Result Value Ref Range Status   Adenovirus NOT DETECTED NOT DETECTED Final   Coronavirus 229E NOT DETECTED NOT DETECTED Final    Comment: (NOTE) The Coronavirus on the Respiratory Panel, DOES NOT test for  the novel  Coronavirus (2019 nCoV)    Coronavirus HKU1 NOT DETECTED NOT DETECTED Final   Coronavirus NL63 NOT DETECTED NOT DETECTED Final   Coronavirus OC43 NOT DETECTED NOT DETECTED Final   Metapneumovirus NOT DETECTED NOT DETECTED Final   Rhinovirus / Enterovirus NOT DETECTED NOT DETECTED Final   Influenza A NOT DETECTED NOT DETECTED Final   Influenza B NOT DETECTED NOT DETECTED Final   Parainfluenza Virus 1 NOT DETECTED NOT DETECTED Final   Parainfluenza Virus 2 NOT DETECTED NOT  DETECTED Final   Parainfluenza Virus 3 NOT DETECTED NOT DETECTED Final   Parainfluenza Virus 4 NOT DETECTED NOT DETECTED Final   Respiratory Syncytial Virus NOT DETECTED NOT DETECTED Final   Bordetella pertussis NOT DETECTED NOT DETECTED Final   Bordetella Parapertussis NOT DETECTED NOT DETECTED Final   Chlamydophila pneumoniae NOT DETECTED NOT DETECTED Final   Mycoplasma pneumoniae NOT DETECTED NOT DETECTED Final    Comment: Performed at [facility], [address] [place], [place] [address]  Resp panel by RT-PCR (RSV, Flu A&B, Covid) Anterior Nasal Swab     Status: None   Collection Time: 08/08/22  7:29 PM   Specimen: Anterior Nasal Swab  Result Value Ref Range Status   SARS Coronavirus 2 by RT PCR NEGATIVE NEGATIVE Final    Comment: (NOTE) SARS-CoV-2 target nucleic acids are NOT DETECTED.  The SARS-CoV-2 RNA is generally detectable in upper respiratory specimens during the acute phase of infection. The lowest concentration of SARS-CoV-2 viral copies this assay can detect is 138 copies/mL. A negative result does not preclude SARS-Cov-2 infection and should not be used as the sole basis for treatment or other patient management decisions. A negative result may occur with  improper specimen collection/handling, submission of specimen other than nasopharyngeal swab, presence of viral mutation(s) within the areas targeted by this assay, and inadequate number of viral copies(<138 copies/mL). A negative result must be combined with clinical observations, patient history, and epidemiological information. The expected result is Negative.  Fact Sheet for Patients:  BloggerCourse.comhttps://www.fda.gov/media/152166/download  Fact Sheet for Healthcare Providers:  SeriousBroker.ithttps://www.fda.gov/media/152162/download  This test is no t yet approved or cleared by the [place] FDA and  has been authorized for detection and/or diagnosis of SARS-CoV-2 by FDA under an Emergency Use Authorization (EUA). This  EUA will remain  in effect (meaning this test can be used) for the duration of the COVID-19 declaration under Section 564(b)(1) of the Act, 21 U.S.C.section 360bbb-3(b)(1), unless the authorization is terminated  or revoked sooner.       Influenza A by PCR NEGATIVE NEGATIVE Final   Influenza B by PCR NEGATIVE NEGATIVE Final    Comment: (NOTE) The Xpert Xpress SARS-CoV-2/FLU/RSV plus assay is intended as an aid in the diagnosis of influenza from Nasopharyngeal swab specimens and should not be used as a sole basis for treatment. Nasal washings and aspirates are unacceptable for Xpert Xpress SARS-CoV-2/FLU/RSV testing.  Fact Sheet for Patients: BloggerCourse.comhttps://www.fda.gov/media/152166/download  Fact Sheet for Healthcare Providers: SeriousBroker.ithttps://www.fda.gov/media/152162/download  This test is not yet approved or cleared by the [place] FDA and has been authorized for detection and/or diagnosis of SARS-CoV-2 by FDA under an Emergency Use Authorization (EUA). This EUA will remain in effect (meaning this test can be used) for the duration of the COVID-19 declaration under Section 564(b)(1) of the Act, 21 U.S.C. section 360bbb-3(b)(1), unless the authorization is terminated or revoked.     Resp Syncytial Virus by PCR NEGATIVE NEGATIVE Final    Comment: (NOTE) Fact  Sheet for Patients: BloggerCourse.com  Fact Sheet for Healthcare Providers: SeriousBroker.it  This test is not yet approved or cleared by the [place] FDA and has been authorized for detection and/or diagnosis of SARS-CoV-2 by FDA under an Emergency Use Authorization (EUA). This EUA will remain in effect (meaning this test can be used) for the duration of the COVID-19 declaration under Section 564(b)(1) of the Act, 21 U.S.C. section 360bbb-3(b)(1), unless the authorization is terminated or revoked.  Performed at [facility], [address] [place], [place] [address]      Labs: BNP (last 3 results) No results for input(s): "BNP" in the last 8760 hours. Basic Metabolic Panel: Recent Labs  Lab 08/08/22 1424 08/10/22 0341 08/11/22 0403  NA 140 135 132*  K 3.9 3.2* 3.0*  CL 102 95* 96*  CO2 23 26 25   GLUCOSE 123* 91 82  BUN 8 6 9   CREATININE 0.83 0.81 0.91  CALCIUM 9.5 9.1 8.5*  MG  --   --  1.4*   Liver Function Tests: Recent Labs  Lab 08/08/22 1424 08/10/22 0341  AST 106* 40  ALT 88* 48*  ALKPHOS 99 78  BILITOT 0.9 2.4*  PROT 7.9 6.5  ALBUMIN 4.2 3.1*   Recent Labs  Lab 08/08/22 1424  LIPASE 30   No results for input(s): "AMMONIA" in the last 168 hours. CBC: Recent Labs  Lab 08/08/22 1424 08/10/22 0341 08/11/22 0403 08/12/22 0904  WBC 21.7* 11.5* 14.5* 8.8  HGB 18.0* 16.7 16.0 17.3*  HCT 51.5 47.8 46.4 49.6  MCV 98.1 100.4* 100.7* 100.2*  PLT 226 149* 144* 202   Cardiac Enzymes: No results for input(s): "CKTOTAL", "CKMB", "CKMBINDEX", "TROPONINI" in the last 168 hours. BNP: Invalid input(s): "POCBNP" CBG: No results for input(s): "GLUCAP" in the last 168 hours. D-Dimer No results for input(s): "DDIMER" in the last 72 hours. Hgb A1c No results for input(s): "HGBA1C" in the last 72 hours. Lipid Profile No results for input(s): "CHOL", "HDL", "LDLCALC", "TRIG", "CHOLHDL", "LDLDIRECT" in the last 72 hours. Thyroid function studies No results for input(s): "TSH", "T4TOTAL", "T3FREE", "THYROIDAB" in the last 72 hours.  Invalid input(s): "FREET3" Anemia work up No results for input(s): "VITAMINB12", "FOLATE", "FERRITIN", "TIBC", "IRON", "RETICCTPCT" in the last 72 hours. Urinalysis    Component Value Date/Time   COLORURINE YELLOW 08/08/2022 1723   APPEARANCEUR CLEAR 08/08/2022 1723   LABSPEC >1.046 (H) 08/08/2022 1723   PHURINE 6.5 08/08/2022 1723   GLUCOSEU NEGATIVE 08/08/2022 1723   HGBUR NEGATIVE 08/08/2022 1723   BILIRUBINUR NEGATIVE 08/08/2022 1723   KETONESUR NEGATIVE  08/08/2022 1723   PROTEINUR TRACE (A) 08/08/2022 1723   NITRITE NEGATIVE 08/08/2022 1723   LEUKOCYTESUR NEGATIVE 08/08/2022 1723   Sepsis Labs Recent Labs  Lab 08/08/22 1424 08/10/22 0341 08/11/22 0403 08/12/22 0904  WBC 21.7* 11.5* 14.5* 8.8   Microbiology Recent Results (from the past 240 hour(s))  Culture, blood (single)     Status: None (Preliminary result)   Collection Time: 08/08/22  5:23 PM   Specimen: BLOOD  Result Value Ref Range Status   Specimen Description   Final    BLOOD LEFT ANTECUBITAL Performed at [facility], [address] [place], [address] [place]    Special Requests   Final    BOTTLES DRAWN AEROBIC AND ANAEROBIC Blood Culture adequate volume Performed at [facility], [address] [place], [address] [place]    Culture   Final    NO GROWTH 3 DAYS Performed at  [facility], [address] [place], [place] [address]    Report Status PENDING  Incomplete  Respiratory (~20 pathogens) panel by PCR     Status: None   Collection Time: 08/08/22  7:29 PM   Specimen: Nasopharyngeal Swab; Respiratory  Result Value Ref Range Status   Adenovirus NOT DETECTED NOT DETECTED Final   Coronavirus 229E NOT DETECTED NOT DETECTED Final    Comment: (NOTE) The Coronavirus on the Respiratory Panel, DOES NOT test for the novel  Coronavirus (2019 nCoV)    Coronavirus HKU1 NOT DETECTED NOT DETECTED Final   Coronavirus NL63 NOT DETECTED NOT DETECTED Final   Coronavirus OC43 NOT DETECTED NOT DETECTED Final   Metapneumovirus NOT DETECTED NOT DETECTED Final   Rhinovirus / Enterovirus NOT DETECTED NOT DETECTED Final   Influenza A NOT DETECTED NOT DETECTED Final   Influenza B NOT DETECTED NOT DETECTED Final   Parainfluenza Virus 1 NOT DETECTED NOT DETECTED Final   Parainfluenza Virus 2 NOT DETECTED NOT DETECTED Final   Parainfluenza Virus 3 NOT DETECTED NOT DETECTED Final   Parainfluenza Virus 4 NOT DETECTED NOT DETECTED Final    Respiratory Syncytial Virus NOT DETECTED NOT DETECTED Final   Bordetella pertussis NOT DETECTED NOT DETECTED Final   Bordetella Parapertussis NOT DETECTED NOT DETECTED Final   Chlamydophila pneumoniae NOT DETECTED NOT DETECTED Final   Mycoplasma pneumoniae NOT DETECTED NOT DETECTED Final    Comment: Performed at [facility], [address] [place], [place] [address]  Resp panel by RT-PCR (RSV, Flu A&B, Covid) Anterior Nasal Swab     Status: None   Collection Time: 08/08/22  7:29 PM   Specimen: Anterior Nasal Swab  Result Value Ref Range Status   SARS Coronavirus 2 by RT PCR NEGATIVE NEGATIVE Final    Comment: (NOTE) SARS-CoV-2 target nucleic acids are NOT DETECTED.  The SARS-CoV-2 RNA is generally detectable in upper respiratory specimens during the acute phase of infection. The lowest concentration of SARS-CoV-2 viral copies this assay can detect is 138 copies/mL. A negative result does not preclude SARS-Cov-2 infection and should not be used as the sole basis for treatment or other patient management decisions. A negative result may occur with  improper specimen collection/handling, submission of specimen other than nasopharyngeal swab, presence of viral mutation(s) within the areas targeted by this assay, and inadequate number of viral copies(<138 copies/mL). A negative result must be combined with clinical observations, patient history, and epidemiological information. The expected result is Negative.  Fact Sheet for Patients:  BloggerCourse.comhttps://www.fda.gov/media/152166/download  Fact Sheet for Healthcare Providers:  SeriousBroker.ithttps://www.fda.gov/media/152162/download  This test is no t yet approved or cleared by the [place] FDA and  has been authorized for detection and/or diagnosis of SARS-CoV-2 by FDA under an Emergency Use Authorization (EUA). This EUA will remain  in effect (meaning this test can be used) for the duration of the COVID-19 declaration under Section 564(b)(1) of  the Act, 21 U.S.C.section 360bbb-3(b)(1), unless the authorization is terminated  or revoked sooner.       Influenza A by PCR NEGATIVE NEGATIVE Final   Influenza B by PCR NEGATIVE NEGATIVE Final    Comment: (NOTE) The Xpert Xpress SARS-CoV-2/FLU/RSV plus assay is intended as an aid in the diagnosis of influenza from Nasopharyngeal swab specimens and should not be used as a sole basis for treatment. Nasal washings and aspirates are unacceptable for Xpert Xpress SARS-CoV-2/FLU/RSV testing.  Fact Sheet for Patients: BloggerCourse.comhttps://www.fda.gov/media/152166/download  Fact Sheet for Healthcare Providers: SeriousBroker.ithttps://www.fda.gov/media/152162/download  This  test is not yet approved or cleared by the [place] and has been authorized for detection and/or diagnosis of SARS-CoV-2 by FDA under an Emergency Use Authorization (EUA). This EUA will remain in effect (meaning this test can be used) for the duration of the COVID-19 declaration under Section 564(b)(1) of the Act, 21 U.S.C. section 360bbb-3(b)(1), unless the authorization is terminated or revoked.     Resp Syncytial Virus by PCR NEGATIVE NEGATIVE Final    Comment: (NOTE) Fact Sheet for Patients: EntrepreneurPulse.com.au  Fact Sheet for Healthcare Providers: IncredibleEmployment.be  This test is not yet approved or cleared by the [place] FDA and has been authorized for detection and/or diagnosis of SARS-CoV-2 by FDA under an Emergency Use Authorization (EUA). This EUA will remain in effect (meaning this test can be used) for the duration of the COVID-19 declaration under Section 564(b)(1) of the Act, 21 U.S.C. section 360bbb-3(b)(1), unless the authorization is terminated or revoked.  Performed at [facility], [address] [place], [place] [address]      Time coordinating discharge: Over 30 minutes  SIGNED:   Donnamarie Poag [place] DO  [facility] 08/12/2022, 10:26 AM

## 2022-08-12 NOTE — TOC Transition Note (Signed)
Transition of Care [facility]) - CM/SW Discharge Note   Patient Details  Name: Stephen Reid MRN: [id number] Date of Birth: 12-Sep-1985  Transition of Care [facility]) CM/SW Contact:  Tom-Johnson, Renea Ee, RN Phone Number: 08/12/2022, 12:45 PM   Clinical Narrative:     Patient is scheduled for discharge today. No TOC needs or recommendations noted. Family to transport at discharge. No further TOC needs noted.        Final next level of care: Home/Self Care Barriers to Discharge: Barriers Resolved   Patient Goals and CMS Choice CMS Medicare.gov Compare Post Acute Care list provided to:: Patient Choice offered to / list presented to : NA  Discharge Placement                  Patient to be transferred to facility by: Family      Discharge Plan and Services Additional resources added to the After Visit Summary for                  DME Arranged: N/A DME Agency: NA       HH Arranged: NA HH Agency: NA        Social Determinants of Health (SDOH) Interventions SDOH Screenings   Food Insecurity: No Food Insecurity (08/12/2022)  Housing: Low Risk  (08/12/2022)  Transportation Needs: No Transportation Needs (08/12/2022)  Utilities: Not At Risk (08/12/2022)  Tobacco Use: High Risk (08/09/2022)     Readmission Risk Interventions     No data to display

## 2022-08-13 LAB — CULTURE, BLOOD (SINGLE)
Culture: NO GROWTH
Special Requests: ADEQUATE

## 2022-08-28 ENCOUNTER — Encounter: Payer: Self-pay | Admitting: Internal Medicine

## 2022-08-28 ENCOUNTER — Ambulatory Visit (INDEPENDENT_AMBULATORY_CARE_PROVIDER_SITE_OTHER): Payer: 59 | Admitting: Internal Medicine

## 2022-08-28 ENCOUNTER — Other Ambulatory Visit: Payer: Self-pay | Admitting: Internal Medicine

## 2022-08-28 VITALS — BP 135/83 | HR 90 | Temp 98.3°F | Ht 71.0 in | Wt 229.4 lb

## 2022-08-28 DIAGNOSIS — F17219 Nicotine dependence, cigarettes, with unspecified nicotine-induced disorders: Secondary | ICD-10-CM

## 2022-08-28 DIAGNOSIS — K219 Gastro-esophageal reflux disease without esophagitis: Secondary | ICD-10-CM | POA: Diagnosis not present

## 2022-08-28 DIAGNOSIS — M549 Dorsalgia, unspecified: Secondary | ICD-10-CM

## 2022-08-28 DIAGNOSIS — E6609 Other obesity due to excess calories: Secondary | ICD-10-CM | POA: Diagnosis not present

## 2022-08-28 DIAGNOSIS — K2970 Gastritis, unspecified, without bleeding: Secondary | ICD-10-CM | POA: Diagnosis not present

## 2022-08-28 DIAGNOSIS — G8929 Other chronic pain: Secondary | ICD-10-CM | POA: Insufficient documentation

## 2022-08-28 DIAGNOSIS — E669 Obesity, unspecified: Secondary | ICD-10-CM | POA: Insufficient documentation

## 2022-08-28 DIAGNOSIS — I10 Essential (primary) hypertension: Secondary | ICD-10-CM | POA: Insufficient documentation

## 2022-08-28 DIAGNOSIS — R638 Other symptoms and signs concerning food and fluid intake: Secondary | ICD-10-CM | POA: Insufficient documentation

## 2022-08-28 MED ORDER — BUPROPION HCL ER (SR) 100 MG PO TB12: 100.0000 mg | ORAL_TABLET | Freq: Every morning | ORAL | 3 refills | Status: DC

## 2022-08-28 MED ORDER — AMLODIPINE BESYLATE 5 MG PO TABS: 5.0000 mg | ORAL_TABLET | Freq: Every day | ORAL | 2 refills | Status: DC

## 2022-08-28 MED ORDER — CELECOXIB 100 MG PO CAPS: 100.0000 mg | ORAL_CAPSULE | Freq: Two times a day (BID) | ORAL | 3 refills | Status: DC

## 2022-08-28 MED ORDER — NICOTINE 10 MG IN INHA: 1.0000 | RESPIRATORY_TRACT | 0 refills | Status: DC | PRN

## 2022-08-28 MED ORDER — NICOTINE POLACRILEX 4 MG MT GUM: 4.0000 mg | CHEWING_GUM | OROMUCOSAL | 5 refills | Status: DC | PRN

## 2022-08-28 MED ORDER — PANTOPRAZOLE SODIUM 20 MG PO TBEC: 20.0000 mg | DELAYED_RELEASE_TABLET | Freq: Every day | ORAL | 1 refills | Status: DC

## 2022-08-28 NOTE — Assessment & Plan Note (Addendum)
Due to severe gastritis secondary to NSAIDs that led to hospitalization I strongly recommend switching to Celebrex which cannot cause gastritis. I explained that I am willing to complete prior Auth if necessary but it is under preferred on his insurance provider.  I am only starting with 100 mg twice daily but I am willing to increase to 200 if he feels that is an adequate.  I explained it is okay to take both of them in the morning if that is the way he prefers to take it which would be similar to how he was taking ibuprofen  Explained and willing to give him physical therapy or neurosurgical referral or try to get x-rays and MRI if he wishes but I did not feel that it was necessary today as it just seems like something can be managed by low-dose NSAIDs so we will try Celebrex

## 2022-08-28 NOTE — Assessment & Plan Note (Signed)
Will taper down pantoprazole to pepcid complete

## 2022-08-28 NOTE — Progress Notes (Unsigned)
[facility]: [phone]   Routine Medical Office Visit  Patient:  Stephen Reid      Age: 37 y.o.       Sex:  male  Date:   08/29/2022  PCP:    Loralee Pacas, [place] Provider: Loralee Pacas, MD   Problem Focused Charting:   Medical Decision Making per Assessment/Plan   Cordie was seen today for new patient (initial visit) and hospitalization follow-up.  Gastritis without bleeding, unspecified chronicity, unspecified gastritis type  Cigarette nicotine dependence with nicotine-induced disorder -     Nicotine; Inhale 1 Cartridge (1 continuous puffing total) into the lungs as needed for smoking cessation.  Dispense: 42 each; Refill: 0 -     Nicotine Polacrilex; Take 1 each (4 mg total) by mouth as needed for smoking cessation.  Dispense: 100 each; Refill: 5  Gastroesophageal reflux disease without esophagitis Overview: History of gastritis hospitalization  Assessment & Plan: Will taper down pantoprazole to pepcid complete  Orders: -     Pantoprazole Sodium; Take 1 tablet (20 mg total) by mouth daily. Use to taper off 40 mg dose Also, try tapering off this as tolerated to only as needed pepcid complete-berry flavor.  Dispense: 30 tablet; Refill: 1  Obesity due to excess calories without serious comorbidity, unspecified classification  Hypertension, unspecified type -     amLODIPine Besylate; Take 1 tablet (5 mg total) by mouth daily. Ok to stop if blood pressure less than 140/90  Dispense: 30 tablet; Refill: 2  Chronic back pain, unspecified back location, unspecified back pain laterality Overview: He has back pain for many years secondary to injuries between the shoulder blades and in the lower back He takes ibuprofen every morning for long-term but had severe gastritis related hospitalization with secondary aspiration pneumonia January 2024.   Assessment & Plan: Due to severe gastritis secondary to NSAIDs that led to  hospitalization I strongly recommend switching to Celebrex which cannot cause gastritis. I explained that I am willing to complete prior Auth if necessary but it is under preferred on his insurance provider.  I am only starting with 100 mg twice daily but I am willing to increase to 200 if he feels that is an adequate.  I explained it is okay to take both of them in the morning if that is the way he prefers to take it which would be similar to how he was taking ibuprofen  Explained and willing to give him physical therapy or neurosurgical referral or try to get x-rays and MRI if he wishes but I did not feel that it was necessary today as it just seems like something can be managed by low-dose NSAIDs so we will try Celebrex  Orders: -     Celecoxib; Take 1 capsule (100 mg total) by mouth 2 (two) times daily.  Dispense: 180 capsule; Refill: 3      We reviewed the hospital discharge summary in detail together, and covered all of the follow up recommendations from the discharging physician He reports he is all better from the hospital, has completely quit drinking, and stomach problems resolved.  I suspect(s) the severe gastritis was from NSAIDs combined with alcohol, so I recommended that he switch ibuprofen to Celebrex going forward, stay off alcohol, and he should be able to taper the pantoprazole- instructions given.  Also did some nicotine cessation counseling and found a few medications that should help him to cut back.    Subjective -  Clinical Presentation:   Stephen HaleJustin T Reid is a 37 y.o. male  Patient Active Problem List   Diagnosis Date Noted   GERD (gastroesophageal reflux disease) 08/28/2022   Obese 08/28/2022   Chronic back pain 08/28/2022   Hypertension 08/28/2022   Marijuana abuse 08/09/2022   Tobacco dependence 08/09/2022   Chronic pain of left ankle 03/05/2020   Past Medical History:  Diagnosis Date   Abdominal pain with vomiting 08/09/2022   Alcohol dependence (HCC)     Cellulitis of left ankle 03/15/2020   GERD (gastroesophageal reflux disease) 08/28/2022   History of gastritis hospitalization   Obese 08/28/2022   Pain in left foot 03/05/2020    Outpatient Medications Prior to Visit  Medication Sig   Multiple Vitamin (MULTIVITAMIN PO) Take 1 tablet by mouth daily.   sucralfate (CARAFATE) 1 g tablet Take 1 tablet (1 g total) by mouth 4 (four) times daily -  with meals and at bedtime.   [DISCONTINUED] amLODipine (NORVASC) 5 MG tablet Take 1 tablet (5 mg total) by mouth daily.   [DISCONTINUED] aspirin 325 MG tablet Take 325 mg by mouth every 6 (six) hours as needed for moderate pain.   [DISCONTINUED] ibuprofen (ADVIL) 200 MG tablet Take 800 mg by mouth in the morning.   [DISCONTINUED] pantoprazole (PROTONIX) 40 MG tablet Take 1 tablet (40 mg total) by mouth daily.   No facility-administered medications prior to visit.    Chief Complaint  Patient presents with   New Patient (Initial Visit)   Hospitalization Follow-up    1/13 due to Sepsis.    He reports he is all better from the hospital, has completely quit drinking, and stomach problems resolved.  Feels much better.  Sleep better. Stomach pain gone.  Hasn't drank alcohol since prior to hospital. Still taking proton pump inhibitor (PPI) stomach acid reducer and ibuprofen and  still smoking.        Objective:  Physical Exam  BP 135/83 (BP Location: Right Arm, Patient Position: Sitting)   Pulse 90   Temp 98.3 F (36.8 C) (Temporal)   Ht 5\' 11"  (1.803 m)   Wt 229 lb 6.4 oz (104.1 kg)   SpO2 96%   BMI 31.99 kg/m  Obese  by BMI criteria but truncal adiposity (waist circumference or caliper) should be used instead. Wt Readings from Last 10 Encounters:  08/28/22 229 lb 6.4 oz (104.1 kg)  08/09/22 241 lb 2.9 oz (109.4 kg)  10/29/11 194 lb (88 kg)   Vital signs reviewed.  Nursing notes reviewed. Weight trend reviewed. General Appearance:  Well developed, well nourished male in no acute distress.    Normal work of breathing at rest Musculoskeletal: All extremities are intact.  Neurological:  Awake, alert,  No obvious focal neurological deficits or cognitive impairments Psychiatric:  Appropriate mood, pleasant demeanor Problem-specific findings: appears healthy and well.   Results Reviewed: No results found for any visits on 08/28/22.  Recent Results (from the past 2160 hour(s))  Lipase, blood     Status: None   Collection Time: 08/08/22  2:24 PM  Result Value Ref Range   Lipase 30 11 - 51 U/L    Comment: Performed at [facility], [address], [place], [place] [address]  Comprehensive metabolic panel     Status: Abnormal   Collection Time: 08/08/22  2:24 PM  Result Value Ref Range   Sodium 140 135 - 145 mmol/L   Potassium 3.9 3.5 - 5.1 mmol/L   Chloride 102 98 - 111  mmol/L   CO2 23 22 - 32 mmol/L   Glucose, Bld 123 (H) 70 - 99 mg/dL    Comment: Glucose reference range applies only to samples taken after fasting for at least 8 hours.   BUN 8 6 - 20 mg/dL   Creatinine, Ser 0.93 0.61 - 1.24 mg/dL   Calcium 9.5 8.9 - 81.8 mg/dL   Total Protein 7.9 6.5 - 8.1 g/dL   Albumin 4.2 3.5 - 5.0 g/dL   AST 299 (H) 15 - 41 U/L   ALT 88 (H) 0 - 44 U/L   Alkaline Phosphatase 99 38 - 126 U/L   Total Bilirubin 0.9 0.3 - 1.2 mg/dL   GFR, Estimated >37 >16 mL/min    Comment: (NOTE) Calculated using the CKD-EPI Creatinine Equation (2021)    Anion gap 15 5 - 15    Comment: Performed at [facility], [address] [place], [place] [address]  CBC     Status: Abnormal   Collection Time: 08/08/22  2:24 PM  Result Value Ref Range   WBC 21.7 (H) 4.0 - 10.5 K/uL   RBC 5.25 4.22 - 5.81 MIL/uL   Hemoglobin 18.0 (H) 13.0 - 17.0 g/dL   HCT 38.1 01.7 - 51.0 %   MCV 98.1 80.0 - 100.0 fL   MCH 34.3 (H) 26.0 - 34.0 pg   MCHC 35.0 30.0 - 36.0 g/dL   RDW 25.8 52.7 - 78.2 %   Platelets 226 150 - 400 K/uL   nRBC 0.0 0.0 - 0.2 %    Comment: Performed at [facility], [address] [place], [place] [address]  Urinalysis, Routine w reflex microscopic Urine, Clean Catch     Status: Abnormal   Collection Time: 08/08/22  5:23 PM  Result Value Ref Range   Color, Urine YELLOW YELLOW   APPearance CLEAR CLEAR   Specific Gravity, Urine >1.046 (H) 1.005 - 1.030   pH 6.5 5.0 - 8.0   Glucose, UA NEGATIVE NEGATIVE mg/dL   Hgb urine dipstick NEGATIVE NEGATIVE   Bilirubin Urine NEGATIVE NEGATIVE   Ketones, ur NEGATIVE NEGATIVE mg/dL   Protein, ur TRACE (A) NEGATIVE mg/dL   Nitrite NEGATIVE NEGATIVE   Leukocytes,Ua NEGATIVE NEGATIVE   RBC / HPF 0-5 0 - 5 RBC/hpf   WBC, UA 0-5 0 - 5 WBC/hpf   Bacteria, UA NONE SEEN NONE SEEN   Squamous Epithelial / HPF 0-5 0 - 5 /HPF   Mucus PRESENT     Comment: Performed at [facility], [address] [place], [place] [address]  Culture, blood (single)     Status: None   Collection Time: 08/08/22  5:23 PM   Specimen: BLOOD  Result Value Ref Range   Specimen Description      BLOOD LEFT ANTECUBITAL Performed at [facility], [address] [place], [place] [address]    Special Requests      BOTTLES DRAWN AEROBIC AND ANAEROBIC Blood Culture adequate volume Performed at [facility], [address], [place], [place] [address]    Culture      NO GROWTH 5 DAYS Performed at [facility], [address] [place], [place] [address]    Report Status 08/13/2022 FINAL   Lactic acid, plasma     Status: Abnormal   Collection Time: 08/08/22  5:37 PM  Result Value Ref Range   Lactic Acid, Venous 2.5 (HH) 0.5 - 1.9 mmol/L    Comment: CRITICAL RESULT CALLED TO,  READ BACK BY AND VERIFIED WITH: Carmon Ginsberg, RN AT 1816 ON [id number] BY Devota Pace Performed at [facility], [address] [place], [place] [address]   Troponin I (High Sensitivity)     Status: None   Collection Time: 08/08/22  7:05 PM  Result Value Ref Range   Troponin I  (High Sensitivity) 4 <18 ng/L    Comment: (NOTE) Elevated high sensitivity troponin I (hsTnI) values and significant  changes across serial measurements may suggest ACS but many other  chronic and acute conditions are known to elevate hsTnI results.  Refer to the "Links" section for chest pain algorithms and additional  guidance. Performed at [facility], [address] [place], [place] [address]   Lactic acid, plasma     Status: None   Collection Time: 08/08/22  7:27 PM  Result Value Ref Range   Lactic Acid, Venous 1.3 0.5 - 1.9 mmol/L    Comment: Performed at [facility], [address], [place], [place] [address]  Respiratory (~20 pathogens) panel by PCR     Status: None   Collection Time: 08/08/22  7:29 PM   Specimen: Nasopharyngeal Swab; Respiratory  Result Value Ref Range   Adenovirus NOT DETECTED NOT DETECTED   Coronavirus 229E NOT DETECTED NOT DETECTED    Comment: (NOTE) The Coronavirus on the Respiratory Panel, DOES NOT test for the novel  Coronavirus (2019 nCoV)    Coronavirus HKU1 NOT DETECTED NOT DETECTED   Coronavirus NL63 NOT DETECTED NOT DETECTED   Coronavirus OC43 NOT DETECTED NOT DETECTED   Metapneumovirus NOT DETECTED NOT DETECTED   Rhinovirus / Enterovirus NOT DETECTED NOT DETECTED   Influenza A NOT DETECTED NOT DETECTED   Influenza B NOT DETECTED NOT DETECTED   Parainfluenza Virus 1 NOT DETECTED NOT DETECTED   Parainfluenza Virus 2 NOT DETECTED NOT DETECTED   Parainfluenza Virus 3 NOT DETECTED NOT DETECTED   Parainfluenza Virus 4 NOT DETECTED NOT DETECTED   Respiratory Syncytial Virus NOT DETECTED NOT DETECTED   Bordetella pertussis NOT DETECTED NOT DETECTED   Bordetella Parapertussis NOT DETECTED NOT DETECTED   Chlamydophila pneumoniae NOT DETECTED NOT DETECTED   Mycoplasma pneumoniae NOT DETECTED NOT DETECTED    Comment: Performed at [facility], [address], [place], [place] [address]  Resp panel by  RT-PCR (RSV, Flu A&B, Covid) Anterior Nasal Swab     Status: None   Collection Time: 08/08/22  7:29 PM   Specimen: Anterior Nasal Swab  Result Value Ref Range   SARS Coronavirus 2 by RT PCR NEGATIVE NEGATIVE    Comment: (NOTE) SARS-CoV-2 target nucleic acids are NOT DETECTED.  The SARS-CoV-2 RNA is generally detectable in upper respiratory specimens during the acute phase of infection. The lowest concentration of SARS-CoV-2 viral copies this assay can detect is 138 copies/mL. A negative result does not preclude SARS-Cov-2 infection and should not be used as the sole basis for treatment or other patient management decisions. A negative result may occur with  improper specimen collection/handling, submission of specimen other than nasopharyngeal swab, presence of viral mutation(s) within the areas targeted by this assay, and inadequate number of viral copies(<138 copies/mL). A negative result must be combined with clinical observations, patient history, and epidemiological information. The expected result is Negative.  Fact Sheet for Patients:  BloggerCourse.com  Fact Sheet for Healthcare Providers:  SeriousBroker.it  This test is no t yet approved or cleared by the [place] FDA and  has been authorized for detection  and/or diagnosis of SARS-CoV-2 by FDA under an Emergency Use Authorization (EUA). This EUA will remain  in effect (meaning this test can be used) for the duration of the COVID-19 declaration under Section 564(b)(1) of the Act, 21 U.S.C.section 360bbb-3(b)(1), unless the authorization is terminated  or revoked sooner.       Influenza A by PCR NEGATIVE NEGATIVE   Influenza B by PCR NEGATIVE NEGATIVE    Comment: (NOTE) The Xpert Xpress SARS-CoV-2/FLU/RSV plus assay is intended as an aid in the diagnosis of influenza from Nasopharyngeal swab specimens and should not be used as a sole basis for treatment. Nasal  washings and aspirates are unacceptable for Xpert Xpress SARS-CoV-2/FLU/RSV testing.  Fact Sheet for Patients: BloggerCourse.comhttps://www.fda.gov/media/152166/download  Fact Sheet for Healthcare Providers: SeriousBroker.ithttps://www.fda.gov/media/152162/download  This test is not yet approved or cleared by the [place] FDA and has been authorized for detection and/or diagnosis of SARS-CoV-2 by FDA under an Emergency Use Authorization (EUA). This EUA will remain in effect (meaning this test can be used) for the duration of the COVID-19 declaration under Section 564(b)(1) of the Act, 21 U.S.C. section 360bbb-3(b)(1), unless the authorization is terminated or revoked.     Resp Syncytial Virus by PCR NEGATIVE NEGATIVE    Comment: (NOTE) Fact Sheet for Patients: BloggerCourse.comhttps://www.fda.gov/media/152166/download  Fact Sheet for Healthcare Providers: SeriousBroker.ithttps://www.fda.gov/media/152162/download  This test is not yet approved or cleared by the [place] FDA and has been authorized for detection and/or diagnosis of SARS-CoV-2 by FDA under an Emergency Use Authorization (EUA). This EUA will remain in effect (meaning this test can be used) for the duration of the COVID-19 declaration under Section 564(b)(1) of the Act, 21 U.S.C. section 360bbb-3(b)(1), unless the authorization is terminated or revoked.  Performed at [facility], [address], [place], [place] [address]   HIV Antibody (routine testing w rflx)     Status: None   Collection Time: 08/09/22 10:42 AM  Result Value Ref Range   HIV Screen 4th Generation wRfx Non Reactive Non Reactive    Comment: Performed at [facility], [address] [place], [place] [address]  Rapid urine drug screen (hospital performed)     Status: Abnormal   Collection Time: 08/09/22 11:22 AM  Result Value Ref Range   Opiates NONE DETECTED NONE DETECTED   Cocaine NONE DETECTED NONE DETECTED   Benzodiazepines POSITIVE (A) NONE DETECTED   Amphetamines  NONE DETECTED NONE DETECTED   Tetrahydrocannabinol NONE DETECTED NONE DETECTED   Barbiturates NONE DETECTED NONE DETECTED    Comment: (NOTE) DRUG SCREEN FOR MEDICAL PURPOSES ONLY.  IF CONFIRMATION IS NEEDED FOR ANY PURPOSE, NOTIFY LAB WITHIN 5 DAYS.  LOWEST DETECTABLE LIMITS FOR URINE DRUG SCREEN Drug Class                     Cutoff (ng/mL) Amphetamine and metabolites    1000 Barbiturate and metabolites    200 Benzodiazepine                 200 Opiates and metabolites        300 Cocaine and metabolites        300 THC                            50 Performed at [facility], [address] [place], [place] [address]   Comprehensive metabolic panel     Status: Abnormal   Collection Time: 08/10/22  3:41  AM  Result Value Ref Range   Sodium 135 135 - 145 mmol/L   Potassium 3.2 (L) 3.5 - 5.1 mmol/L   Chloride 95 (L) 98 - 111 mmol/L   CO2 26 22 - 32 mmol/L   Glucose, Bld 91 70 - 99 mg/dL    Comment: Glucose reference range applies only to samples taken after fasting for at least 8 hours.   BUN 6 6 - 20 mg/dL   Creatinine, Ser 0.81 0.61 - 1.24 mg/dL   Calcium 9.1 8.9 - 10.3 mg/dL   Total Protein 6.5 6.5 - 8.1 g/dL   Albumin 3.1 (L) 3.5 - 5.0 g/dL   AST 40 15 - 41 U/L   ALT 48 (H) 0 - 44 U/L   Alkaline Phosphatase 78 38 - 126 U/L   Total Bilirubin 2.4 (H) 0.3 - 1.2 mg/dL   GFR, Estimated >60 >60 mL/min    Comment: (NOTE) Calculated using the CKD-EPI Creatinine Equation (2021)    Anion gap 14 5 - 15    Comment: Performed at [facility], [place] [address], [place], [place] [address]  CBC     Status: Abnormal   Collection Time: 08/10/22  3:41 AM  Result Value Ref Range   WBC 11.5 (H) 4.0 - 10.5 K/uL   RBC 4.76 4.22 - 5.81 MIL/uL   Hemoglobin 16.7 13.0 - 17.0 g/dL   HCT 47.8 39.0 - 52.0 %   MCV 100.4 (H) 80.0 - 100.0 fL   MCH 35.1 (H) 26.0 - 34.0 pg   MCHC 34.9 30.0 - 36.0 g/dL   RDW 12.5 11.5 - 15.5 %   Platelets 149 (L) 150 - 400 K/uL   nRBC 0.0 0.0 - 0.2 %     Comment: Performed at [facility], [place] [address] [place], [place] [address]  Protime-INR     Status: None   Collection Time: 08/10/22  3:41 AM  Result Value Ref Range   Prothrombin Time 13.7 11.4 - 15.2 seconds   INR 1.1 0.8 - 1.2    Comment: (NOTE) INR goal varies based on device and disease states. Performed at [facility], [place] [address] [place], [place] [address]   Basic metabolic panel     Status: Abnormal   Collection Time: 08/11/22  4:03 AM  Result Value Ref Range   Sodium 132 (L) 135 - 145 mmol/L   Potassium 3.0 (L) 3.5 - 5.1 mmol/L   Chloride 96 (L) 98 - 111 mmol/L   CO2 25 22 - 32 mmol/L   Glucose, Bld 82 70 - 99 mg/dL    Comment: Glucose reference range applies only to samples taken after fasting for at least 8 hours.   BUN 9 6 - 20 mg/dL   Creatinine, Ser 0.91 0.61 - 1.24 mg/dL   Calcium 8.5 (L) 8.9 - 10.3 mg/dL   GFR, Estimated >60 >60 mL/min    Comment: (NOTE) Calculated using the CKD-EPI Creatinine Equation (2021)    Anion gap 11 5 - 15    Comment: Performed at [facility] [address] [place] [address]  CBC     Status: Abnormal   Collection Time: 08/11/22  4:03 AM  Result Value Ref Range   WBC 14.5 (H) 4.0 - 10.5 K/uL   RBC 4.61 4.22 - 5.81 MIL/uL   Hemoglobin 16.0 13.0 - 17.0 g/dL   HCT 46.4 39.0 - 52.0 %   MCV 100.7 (H) 80.0 - 100.0 fL  MCH 34.7 (H) 26.0 - 34.0 pg   MCHC 34.5 30.0 - 36.0 g/dL   RDW 26.7 12.4 - 58.0 %   Platelets 144 (L) 150 - 400 K/uL   nRBC 0.0 0.0 - 0.2 %    Comment: Performed at [facility], [address] [place], [place] [address]  Magnesium     Status: Abnormal   Collection Time: 08/11/22  4:03 AM  Result Value Ref Range   Magnesium 1.4 (L) 1.7 - 2.4 mg/dL    Comment: Performed at [facility], [address], [place], [place] [address]  Procalcitonin - Baseline     Status: None   Collection Time: 08/11/22  4:03 AM  Result Value Ref Range   Procalcitonin 0.10 ng/mL     Comment:        Interpretation: PCT (Procalcitonin) <= 0.5 ng/mL: Systemic infection (sepsis) is not likely. Local bacterial infection is possible. (NOTE)       Sepsis PCT Algorithm           Lower Respiratory Tract                                      Infection PCT Algorithm    ----------------------------     ----------------------------         PCT < 0.25 ng/mL                PCT < 0.10 ng/mL          Strongly encourage             Strongly discourage   discontinuation of antibiotics    initiation of antibiotics    ----------------------------     -----------------------------       PCT 0.25 - 0.50 ng/mL            PCT 0.10 - 0.25 ng/mL               OR       >80% decrease in PCT            Discourage initiation of                                            antibiotics      Encourage discontinuation           of antibiotics    ----------------------------     -----------------------------         PCT >= 0.50 ng/mL              PCT 0.26 - 0.50 ng/mL               AND        <80% decrease in PCT             Encourage initiation of                                             antibiotics       Encourage continuation           of antibiotics    ----------------------------     -----------------------------        PCT >=  0.50 ng/mL                  PCT > 0.50 ng/mL               AND         increase in PCT                  Strongly encourage                                      initiation of antibiotics    Strongly encourage escalation           of antibiotics                                     -----------------------------                                           PCT <= 0.25 ng/mL                                                 OR                                        > 80% decrease in PCT                                      Discontinue / Do not initiate                                             antibiotics  Performed at [facility], [address]  [place], [place] [address]   CBC     Status: Abnormal   Collection Time: 08/12/22  9:04 AM  Result Value Ref Range   WBC 8.8 4.0 - 10.5 K/uL   RBC 4.95 4.22 - 5.81 MIL/uL   Hemoglobin 17.3 (H) 13.0 - 17.0 g/dL   HCT 60.449.6 54.039.0 - 98.152.0 %   MCV 100.2 (H) 80.0 - 100.0 fL   MCH 34.9 (H) 26.0 - 34.0 pg   MCHC 34.9 30.0 - 36.0 g/dL   RDW 19.112.6 47.811.5 - 29.515.5 %   Platelets 202 150 - 400 K/uL   nRBC 0.0 0.0 - 0.2 %    Comment: Performed at [facility], [address] [place], [place] [address]  Comprehensive metabolic panel     Status: Abnormal   Collection Time: 08/12/22  9:04 AM  Result Value Ref Range   Sodium 135 135 - 145 mmol/L   Potassium 3.1 (L) 3.5 - 5.1 mmol/L   Chloride 100 98 - 111 mmol/L   CO2 21 (L) 22 - 32 mmol/L   Glucose, Bld 97 70 - 99 mg/dL    Comment: Glucose reference range applies  only to samples taken after fasting for at least 8 hours.   BUN 10 6 - 20 mg/dL   Creatinine, Ser 1.04 0.61 - 1.24 mg/dL   Calcium 8.8 (L) 8.9 - 10.3 mg/dL   Total Protein 7.0 6.5 - 8.1 g/dL   Albumin 3.1 (L) 3.5 - 5.0 g/dL   AST 33 15 - 41 U/L   ALT 35 0 - 44 U/L   Alkaline Phosphatase 73 38 - 126 U/L   Total Bilirubin 1.7 (H) 0.3 - 1.2 mg/dL   GFR, Estimated >60 >60 mL/min    Comment: (NOTE) Calculated using the CKD-EPI Creatinine Equation (2021)    Anion gap 14 5 - 15    Comment: Performed at [facility], [place] [address], [place], [place] [address]  Magnesium     Status: Abnormal   Collection Time: 08/12/22  9:04 AM  Result Value Ref Range   Magnesium 1.6 (L) 1.7 - 2.4 mg/dL    Comment: Performed at [facility] [address] [place], [place] [address]          Signed: Loralee Pacas, MD 08/29/2022 7:13 AM

## 2023-05-14 ENCOUNTER — Ambulatory Visit: Payer: 59 | Admitting: Internal Medicine

## 2023-05-14 ENCOUNTER — Encounter: Payer: Self-pay | Admitting: Internal Medicine

## 2023-05-14 VITALS — BP 130/88 | HR 74 | Temp 98.3°F | Ht 71.0 in | Wt 209.0 lb

## 2023-05-14 DIAGNOSIS — F325 Major depressive disorder, single episode, in full remission: Secondary | ICD-10-CM | POA: Diagnosis not present

## 2023-05-14 DIAGNOSIS — R82998 Other abnormal findings in urine: Secondary | ICD-10-CM

## 2023-05-14 DIAGNOSIS — R17 Unspecified jaundice: Secondary | ICD-10-CM

## 2023-05-14 DIAGNOSIS — M549 Dorsalgia, unspecified: Secondary | ICD-10-CM

## 2023-05-14 DIAGNOSIS — F17219 Nicotine dependence, cigarettes, with unspecified nicotine-induced disorders: Secondary | ICD-10-CM

## 2023-05-14 DIAGNOSIS — G8929 Other chronic pain: Secondary | ICD-10-CM

## 2023-05-14 DIAGNOSIS — I10 Essential (primary) hypertension: Secondary | ICD-10-CM

## 2023-05-14 DIAGNOSIS — R7989 Other specified abnormal findings of blood chemistry: Secondary | ICD-10-CM

## 2023-05-14 DIAGNOSIS — R110 Nausea: Secondary | ICD-10-CM

## 2023-05-14 DIAGNOSIS — Z87891 Personal history of nicotine dependence: Secondary | ICD-10-CM

## 2023-05-14 DIAGNOSIS — K709 Alcoholic liver disease, unspecified: Secondary | ICD-10-CM

## 2023-05-14 DIAGNOSIS — E65 Localized adiposity: Secondary | ICD-10-CM

## 2023-05-14 LAB — POCT URINALYSIS DIPSTICK
Bilirubin, UA: NEGATIVE
Blood, UA: NEGATIVE
Glucose, UA: NEGATIVE
Ketones, UA: NEGATIVE
Leukocytes, UA: NEGATIVE
Nitrite, UA: NEGATIVE
Protein, UA: NEGATIVE
Spec Grav, UA: 1.01 (ref 1.010–1.025)
Urobilinogen, UA: 0.2 U/dL
pH, UA: 6 (ref 5.0–8.0)

## 2023-05-14 LAB — CBC WITH DIFFERENTIAL/PLATELET
Basophils Absolute: 0 10*3/uL (ref 0.0–0.1)
Basophils Relative: 0.4 % (ref 0.0–3.0)
Eosinophils Absolute: 0.2 10*3/uL (ref 0.0–0.7)
Eosinophils Relative: 2.3 % (ref 0.0–5.0)
HCT: 46.4 % (ref 39.0–52.0)
Hemoglobin: 15.4 g/dL (ref 13.0–17.0)
Lymphocytes Relative: 22.7 % (ref 12.0–46.0)
Lymphs Abs: 1.9 10*3/uL (ref 0.7–4.0)
MCHC: 33.1 g/dL (ref 30.0–36.0)
MCV: 88.9 fL (ref 78.0–100.0)
Monocytes Absolute: 0.8 10*3/uL (ref 0.1–1.0)
Monocytes Relative: 9.1 % (ref 3.0–12.0)
Neutro Abs: 5.6 10*3/uL (ref 1.4–7.7)
Neutrophils Relative %: 65.5 % (ref 43.0–77.0)
Platelets: 240 10*3/uL (ref 150.0–400.0)
RBC: 5.22 Mil/uL (ref 4.22–5.81)
RDW: 13.3 % (ref 11.5–15.5)
WBC: 8.5 10*3/uL (ref 4.0–10.5)

## 2023-05-14 LAB — LIPID PANEL
Cholesterol: 161 mg/dL (ref 0–200)
HDL: 46.9 mg/dL (ref >39.00)
LDL Cholesterol: 94 mg/dL (ref 0–99)
NonHDL: 114.03
Total CHOL/HDL Ratio: 3
Triglycerides: 98 mg/dL (ref 0.0–149.0)
VLDL: 19.6 mg/dL (ref 0.0–40.0)

## 2023-05-14 LAB — COMPREHENSIVE METABOLIC PANEL
ALT: 609 U/L — ABNORMAL HIGH (ref 0–53)
AST: 207 U/L — ABNORMAL HIGH (ref 0–37)
Albumin: 4.4 g/dL (ref 3.5–5.2)
Alkaline Phosphatase: 220 U/L — ABNORMAL HIGH (ref 39–117)
BUN: 9 mg/dL (ref 6–23)
CO2: 30 meq/L (ref 19–32)
Calcium: 10.3 mg/dL (ref 8.4–10.5)
Chloride: 96 meq/L (ref 96–112)
Creatinine, Ser: 0.87 mg/dL (ref 0.40–1.50)
GFR: 108.82 mL/min (ref >60.00)
Glucose, Bld: 93 mg/dL (ref 70–99)
Potassium: 4.4 meq/L (ref 3.5–5.1)
Sodium: 135 meq/L (ref 135–145)
Total Bilirubin: 4.1 mg/dL — ABNORMAL HIGH (ref 0.2–1.2)
Total Protein: 7.6 g/dL (ref 6.0–8.3)

## 2023-05-14 LAB — URINALYSIS, ROUTINE W REFLEX MICROSCOPIC
Hgb urine dipstick: NEGATIVE
Ketones, ur: NEGATIVE
Leukocytes,Ua: NEGATIVE
Nitrite: NEGATIVE
RBC / HPF: NONE SEEN (ref 0–?)
Specific Gravity, Urine: <=1.005 — AB (ref 1.000–1.030)
Total Protein, Urine: NEGATIVE
Urine Glucose: NEGATIVE
Urobilinogen, UA: 0.2 (ref 0.0–1.0)
WBC, UA: NONE SEEN (ref 0–?)
pH: 6.5 (ref 5.0–8.0)

## 2023-05-14 LAB — GAMMA GT: GGT: 430 U/L — ABNORMAL HIGH (ref 7–51)

## 2023-05-14 MED ORDER — CELECOXIB 100 MG PO CAPS
100.0000 mg | ORAL_CAPSULE | Freq: Two times a day (BID) | ORAL | 3 refills | Status: DC
Start: 2023-05-14 — End: 2023-05-23

## 2023-05-14 MED ORDER — BUPROPION HCL ER (SR) 100 MG PO TB12: 100.0000 mg | ORAL_TABLET | Freq: Every day | ORAL | 3 refills | Status: DC

## 2023-05-14 MED ORDER — ONDANSETRON 4 MG PO TBDP
4.0000 mg | ORAL_TABLET | ORAL | 2 refills | Status: AC | PRN
Start: 2023-05-14 — End: ?

## 2023-05-14 NOTE — Assessment & Plan Note (Signed)
Managed with Celebrex 100mg , he will continue Celebrex 100mg  as needed.

## 2023-05-14 NOTE — Assessment & Plan Note (Signed)
Managed with Amlodipine 5mg , he will continue Amlodipine 5mg  daily.

## 2023-05-14 NOTE — Progress Notes (Signed)
[facility]: [phone]   -- Medical Office Visit --  Patient:  Stephen Reid      Age: 37 y.o.       Sex:  male  Date:   05/14/2023 Patient Care Team: Lula Olszewski, MD as PCP - General (Internal Medicine) Today's Healthcare Provider: Lula Olszewski, MD   Assessment & Plan Dark urine  Nausea  Scleral icterus  Alcoholic liver disease (HCC) Recent symptoms including dark urine, icterus, and nausea have been noted, alongside a history of alcohol use, though he has been sober for a year. We discussed the potential for early liver failure and emphasized the importance of diet and lifestyle modifications. We will order comprehensive blood work including liver function tests, bilirubin, GGT, and FibroSure with ELF to assess liver damage. Zofran will be started for nausea. A referral to a hepatologist for long-term management and monitoring is planned. He is advised to continue avoiding alcohol and to maintain a diet low in red meat, processed meat, saturated fats, and added sugars, while encouraging the consumption of oily fish, nuts, tea, avocado, olive oil, and plant-based foods. Cigarette nicotine dependence with nicotine-induced disorder  Major depressive disorder in full remission, unspecified whether recurrent (HCC) Currently managed on Wellbutrin 100mg , we will refill the Wellbutrin prescription. Chronic back pain, unspecified back location, unspecified back pain laterality Managed with Celebrex 100mg , he will continue Celebrex 100mg  as needed. Hypertension, unspecified type Managed with Amlodipine 5mg , he will continue Amlodipine 5mg  daily. Central adiposity Significant weight loss has been achieved, now classified as overweight rather than obese. We encourage continued healthy lifestyle habits. Ex-smoker  He has quit both cannabis and nicotine substances. We encourage continued abstinence. resolved  Diagnoses and all orders for this visit: Dark  urine -     Urinalysis, Routine w reflex microscopic -     FIB-4 W/REFLEX TO ELF -     Lipid panel -     Comprehensive metabolic panel -     CBC with Differential/Platelet -     TSH Rfx on Abnormal to Free T4 -     Lipid Panel w/reflex Direct LDL -     Gamma GT -     Bilirubin, fractionated(tot/dir/indir) -     ondansetron (ZOFRAN-ODT) 4 MG disintegrating tablet; Take 1 tablet (4 mg total) by mouth every 4 (four) hours as needed for nausea or vomiting. -     POCT Urinalysis Dipstick -     Amb Referral to Hepatology Nausea -     Urinalysis, Routine w reflex microscopic -     FIB-4 W/REFLEX TO ELF -     Lipid panel -     Comprehensive metabolic panel -     CBC with Differential/Platelet -     TSH Rfx on Abnormal to Free T4 -     Lipid Panel w/reflex Direct LDL -     Gamma GT -     Bilirubin, fractionated(tot/dir/indir) -     ondansetron (ZOFRAN-ODT) 4 MG disintegrating tablet; Take 1 tablet (4 mg total) by mouth every 4 (four) hours as needed for nausea or vomiting. -     Amb Referral to Hepatology Scleral icterus -     Urinalysis, Routine w reflex microscopic -     FIB-4 W/REFLEX TO ELF -     Lipid panel -     Comprehensive metabolic panel -     CBC with Differential/Platelet -     TSH Rfx on Abnormal to Free  T4 -     Lipid Panel w/reflex Direct LDL -     Gamma GT -     Bilirubin, fractionated(tot/dir/indir) -     ondansetron (ZOFRAN-ODT) 4 MG disintegrating tablet; Take 1 tablet (4 mg total) by mouth every 4 (four) hours as needed for nausea or vomiting. -     Amb Referral to Hepatology Alcoholic liver disease (HCC) -     Amb Referral to Hepatology Cigarette nicotine dependence with nicotine-induced disorder -     buPROPion ER (WELLBUTRIN SR) 100 MG 12 hr tablet; Take 1 tablet (100 mg total) by mouth daily. Start at full tablet daily due to it cannot be split in half -     Amb Referral to Hepatology  Recommended follow-up: No follow-ups on file. Future Appointments  Date  Time Provider Department Center  08/30/2023  2:00 PM Lula Olszewski, MD [facility]        Subjective   37 y.o. male who has Tobacco dependence; Chronic pain of left ankle; GERD (gastroesophageal reflux disease); Weight disorder; Chronic back pain; and Hypertension on their problem list. His reasons/main concerns/chief complaints for today's office visit are Nausea, Dark urine, and Medication Refill (Wellbutrin and celebrex.)   ------------------------------------------------------------------------------------------------------------------------ AI-Extracted: Discussed the use of AI scribe software for clinical note transcription with the patient, who gave verbal consent to proceed.  History of Present Illness   The patient, with a history of alcohol-induced liver cirrhosis, chronic back pain, left ankle pain, GERD, hypertension, and a history of cannabis and tobacco use, presents with recent onset of dark urine and icteric eyes. The patient has been sober for a year and has lost significant weight, transitioning from obesity to overweight status. He reports cessation of cannabis use, attributing this change to the effects of Wellbutrin, which has also been beneficial for his depression and anxiety.  The patient has been experiencing a flare of symptoms for the past week, including nausea and extreme fatigue. He reports a change in urine color to a darker shade and the development of scleral icterus. He also notes a decrease in urine output despite increased water intake. The patient has been adhering to dietary changes, including the elimination of red meat from his diet. He has also increased his physical activity, engaging in regular running and walking.  The patient has been managing his chronic back and ankle pain with Celebrex, and his hypertension with amlodipine. He has discontinued the use of Nicotrol and Nicorette, and has recently stopped taking Protonix and Spravato for his GERD, which  he reports has been flaring up recently. He describes the pain as similar to a previous episode of gastroenteritis.  The patient has been consuming artificial sweeteners in his diet, specifically in his coffee and non-fat carbonated beverages. He expresses concern about the potential impact of these sweeteners on his liver health. He also reports a recent illness, which he suspects may have triggered the current flare of symptoms.      He has a past medical history of Abdominal pain with vomiting (08/09/2022), Alcohol dependence (HCC), Cellulitis of left ankle (03/15/2020), GERD (gastroesophageal reflux disease) (08/28/2022), Marijuana abuse (08/09/2022), Obese (08/28/2022), and Pain in left foot (03/05/2020).  Problem list overviews that were updated at today's visit: Problem  Weight Disorder  Marijuana Abuse (Resolved)   Current Outpatient Medications on File Prior to Visit  Medication Sig   celecoxib (CELEBREX) 100 MG capsule Take 1 capsule (100 mg total) by mouth 2 (two) times daily.   amLODipine (  NORVASC) 5 MG tablet Take 1 tablet (5 mg total) by mouth daily. Ok to stop if blood pressure less than 140/90   No current facility-administered medications on file prior to visit.   Medications Discontinued During This Encounter  Medication Reason   Multiple Vitamin (MULTIVITAMIN PO) Patient Preference   pantoprazole (PROTONIX) 20 MG tablet Patient Preference   sucralfate (CARAFATE) 1 g tablet Patient Preference   nicotine (NICOTROL) 10 MG inhaler Patient Preference   nicotine polacrilex (NICORETTE) 4 MG gum Patient Preference   buPROPion ER (WELLBUTRIN SR) 100 MG 12 hr tablet Reorder     Objective   Physical Exam  BP 130/88 (BP Location: Right Arm, Patient Position: Sitting)   Pulse 74   Temp 98.3 F (36.8 C) (Temporal)   Ht 5\' 11"  (1.803 m)   Wt 209 lb (94.8 kg)   SpO2 97%   BMI 29.15 kg/m  Wt Readings from Last 10 Encounters:  05/14/23 209 lb (94.8 kg)  08/28/22 229 lb 6.4  oz (104.1 kg)  08/09/22 241 lb 2.9 oz (109.4 kg)  10/29/11 194 lb (88 kg)   Vital signs reviewed.  Nursing notes reviewed. Weight trend reviewed. Abnormalities and Problem-Specific physical exam findings:  scleral icterus  General Appearance:  No acute distress appreciable.   Well-groomed, healthy-appearing male.  Well proportioned with no abnormal fat distribution.  Good muscle tone. Pulmonary:  Normal work of breathing at rest, no respiratory distress apparent. SpO2: 97 %  Musculoskeletal: All extremities are intact.  Neurological:  Awake, alert, oriented, and engaged.  No obvious focal neurological deficits or cognitive impairments.  Sensorium seems unclouded.   Speech is clear and coherent with logical content. Psychiatric:  Appropriate mood, pleasant and cooperative demeanor, thoughtful and engaged during the exam  Results   LABS Bilirubin: elevated        Results for orders placed or performed in visit on 05/14/23  Urinalysis, Routine w reflex microscopic  Result Value Ref Range   Color, Urine YELLOW Yellow;Lt. Yellow;Straw;Dark Yellow;Amber;Green;Red;Brown   APPearance CLEAR Clear;Turbid;Slightly Cloudy;Cloudy   Specific Gravity, Urine <=1.005 (A) 1.000 - 1.030   pH 6.5 5.0 - 8.0   Total Protein, Urine NEGATIVE Negative   Urine Glucose NEGATIVE Negative   Ketones, ur NEGATIVE Negative   Bilirubin Urine SMALL (A) Negative   Hgb urine dipstick NEGATIVE Negative   Urobilinogen, UA 0.2 0.0 - 1.0   Leukocytes,Ua NEGATIVE Negative   Nitrite NEGATIVE Negative   WBC, UA none seen 0-2/hpf   RBC / HPF none seen 0-2/hpf  Lipid panel  Result Value Ref Range   Cholesterol 161 0 - 200 mg/dL   Triglycerides 09.8 0.0 - 149.0 mg/dL   HDL 11.91 >47.82 mg/dL   VLDL 95.6 0.0 - 21.3 mg/dL   LDL Cholesterol 94 0 - 99 mg/dL   Total CHOL/HDL Ratio 3    NonHDL 114.03   Comprehensive metabolic panel  Result Value Ref Range   Sodium 135 135 - 145 mEq/L   Potassium 4.4 3.5 - 5.1 mEq/L    Chloride 96 96 - 112 mEq/L   CO2 30 19 - 32 mEq/L   Glucose, Bld 93 70 - 99 mg/dL   BUN 9 6 - 23 mg/dL   Creatinine, Ser 0.86 0.40 - 1.50 mg/dL   Total Bilirubin 4.1 (H) 0.2 - 1.2 mg/dL   Alkaline Phosphatase 220 (H) 39 - 117 U/L   AST 207 (H) 0 - 37 U/L   ALT 609 (H) 0 - 53 U/L  Total Protein 7.6 6.0 - 8.3 g/dL   Albumin 4.4 3.5 - 5.2 g/dL   GFR 130.86 >57.84 mL/min   Calcium 10.3 8.4 - 10.5 mg/dL  CBC with Differential/Platelet  Result Value Ref Range   WBC 8.5 4.0 - 10.5 K/uL   RBC 5.22 4.22 - 5.81 Mil/uL   Hemoglobin 15.4 13.0 - 17.0 g/dL   HCT 69.6 29.5 - 28.4 %   MCV 88.9 78.0 - 100.0 fl   MCHC 33.1 30.0 - 36.0 g/dL   RDW 13.2 44.0 - 10.2 %   Platelets 240.0 150.0 - 400.0 K/uL   Neutrophils Relative % 65.5 43.0 - 77.0 %   Lymphocytes Relative 22.7 12.0 - 46.0 %   Monocytes Relative 9.1 3.0 - 12.0 %   Eosinophils Relative 2.3 0.0 - 5.0 %   Basophils Relative 0.4 0.0 - 3.0 %   Neutro Abs 5.6 1.4 - 7.7 K/uL   Lymphs Abs 1.9 0.7 - 4.0 K/uL   Monocytes Absolute 0.8 0.1 - 1.0 K/uL   Eosinophils Absolute 0.2 0.0 - 0.7 K/uL   Basophils Absolute 0.0 0.0 - 0.1 K/uL  Gamma GT  Result Value Ref Range   GGT 430 (H) 7 - 51 U/L  POCT Urinalysis Dipstick  Result Value Ref Range   Color, UA dark yellow    Clarity, UA clear    Glucose, UA Negative Negative   Bilirubin, UA Negative    Ketones, UA Negative    Spec Grav, UA 1.010 1.010 - 1.025   Blood, UA Negative    pH, UA 6.0 5.0 - 8.0   Protein, UA Negative Negative   Urobilinogen, UA 0.2 0.2 or 1.0 E.U./dL   Nitrite, UA Negative    Leukocytes, UA Negative Negative   Appearance     Odor      Office Visit on 05/14/2023  Component Date Value   Color, Urine 05/14/2023 YELLOW    APPearance 05/14/2023 CLEAR    Specific Gravity, Urine 05/14/2023 <=1.005 (A)    pH 05/14/2023 6.5    Total Protein, Urine 05/14/2023 NEGATIVE    Urine Glucose 05/14/2023 NEGATIVE    Ketones, ur 05/14/2023 NEGATIVE    Bilirubin Urine 05/14/2023  SMALL (A)    Hgb urine dipstick 05/14/2023 NEGATIVE    Urobilinogen, UA 05/14/2023 0.2    Leukocytes,Ua 05/14/2023 NEGATIVE    Nitrite 05/14/2023 NEGATIVE    WBC, UA 05/14/2023 none seen    RBC / HPF 05/14/2023 none seen    Cholesterol 05/14/2023 161    Triglycerides 05/14/2023 98.0    HDL 05/14/2023 46.90    VLDL 05/14/2023 19.6    LDL Cholesterol 05/14/2023 94    Total CHOL/HDL Ratio 05/14/2023 3    NonHDL 05/14/2023 114.03    Sodium 05/14/2023 135    Potassium 05/14/2023 4.4    Chloride 05/14/2023 96    CO2 05/14/2023 30    Glucose, Bld 05/14/2023 93    BUN 05/14/2023 9    Creatinine, Ser 05/14/2023 0.87    Total Bilirubin 05/14/2023 4.1 (H)    Alkaline Phosphatase 05/14/2023 220 (H)    AST 05/14/2023 207 (H)    ALT 05/14/2023 609 (H)    Total Protein 05/14/2023 7.6    Albumin 05/14/2023 4.4    GFR 05/14/2023 108.82    Calcium 05/14/2023 10.3    WBC 05/14/2023 8.5    RBC 05/14/2023 5.22    Hemoglobin 05/14/2023 15.4    HCT 05/14/2023 46.4    MCV 05/14/2023 88.9  MCHC 05/14/2023 33.1    RDW 05/14/2023 13.3    Platelets 05/14/2023 240.0    Neutrophils Relative % 05/14/2023 65.5    Lymphocytes Relative 05/14/2023 22.7    Monocytes Relative 05/14/2023 9.1    Eosinophils Relative 05/14/2023 2.3    Basophils Relative 05/14/2023 0.4    Neutro Abs 05/14/2023 5.6    Lymphs Abs 05/14/2023 1.9    Monocytes Absolute 05/14/2023 0.8    Eosinophils Absolute 05/14/2023 0.2    Basophils Absolute 05/14/2023 0.0    GGT 05/14/2023 430 (H)    Color, UA 05/14/2023 dark yellow    Clarity, UA 05/14/2023 clear    Glucose, UA 05/14/2023 Negative    Bilirubin, UA 05/14/2023 Negative    Ketones, UA 05/14/2023 Negative    Spec Grav, UA 05/14/2023 1.010    Blood, UA 05/14/2023 Negative    pH, UA 05/14/2023 6.0    Protein, UA 05/14/2023 Negative    Urobilinogen, UA 05/14/2023 0.2    Nitrite, UA 05/14/2023 Negative    Leukocytes, UA 05/14/2023 Negative   Admission on 08/08/2022,  Discharged on 08/12/2022  Component Date Value   Lipase 08/08/2022 30    Sodium 08/08/2022 140    Potassium 08/08/2022 3.9    Chloride 08/08/2022 102    CO2 08/08/2022 23    Glucose, Bld 08/08/2022 123 (H)    BUN 08/08/2022 8    Creatinine, Ser 08/08/2022 0.83    Calcium 08/08/2022 9.5    Total Protein 08/08/2022 7.9    Albumin 08/08/2022 4.2    AST 08/08/2022 106 (H)    ALT 08/08/2022 88 (H)    Alkaline Phosphatase 08/08/2022 99    Total Bilirubin 08/08/2022 0.9    GFR, Estimated 08/08/2022 >60    Anion gap 08/08/2022 15    WBC 08/08/2022 21.7 (H)    RBC 08/08/2022 5.25    Hemoglobin 08/08/2022 18.0 (H)    HCT 08/08/2022 51.5    MCV 08/08/2022 98.1    MCH 08/08/2022 34.3 (H)    MCHC 08/08/2022 35.0    RDW 08/08/2022 12.7    Platelets 08/08/2022 226    nRBC 08/08/2022 0.0    Color, Urine 08/08/2022 YELLOW    APPearance 08/08/2022 CLEAR    Specific Gravity, Urine 08/08/2022 >1.046 (H)    pH 08/08/2022 6.5    Glucose, UA 08/08/2022 NEGATIVE    Hgb urine dipstick 08/08/2022 NEGATIVE    Bilirubin Urine 08/08/2022 NEGATIVE    Ketones, ur 08/08/2022 NEGATIVE    Protein, ur 08/08/2022 TRACE (A)    Nitrite 08/08/2022 NEGATIVE    Leukocytes,Ua 08/08/2022 NEGATIVE    RBC / HPF 08/08/2022 0-5    WBC, UA 08/08/2022 0-5    Bacteria, UA 08/08/2022 NONE SEEN    Squamous Epithelial / HPF 08/08/2022 0-5    Mucus 08/08/2022 PRESENT    Lactic Acid, Venous 08/08/2022 2.5 (HH)    Lactic Acid, Venous 08/08/2022 1.3    Specimen Description 08/08/2022                     Value:BLOOD LEFT ANTECUBITAL Performed at [facility], [address] [place], [place] [address]    Special Requests 08/08/2022                     Value:BOTTLES DRAWN AEROBIC AND ANAEROBIC Blood Culture adequate volume Performed at [facility], [address] [place], [place] [address]    Culture 08/08/2022  Value:NO GROWTH 5 DAYS Performed at [facility],  [address], [place], [place] [address]    Report Status 08/08/2022 08/13/2022 FINAL    Troponin I (High Sensiti* 08/08/2022 4    Adenovirus 08/08/2022 NOT DETECTED    Coronavirus 229E 08/08/2022 NOT DETECTED    Coronavirus HKU1 08/08/2022 NOT DETECTED    Coronavirus NL63 08/08/2022 NOT DETECTED    Coronavirus OC43 08/08/2022 NOT DETECTED    Metapneumovirus 08/08/2022 NOT DETECTED    Rhinovirus / Enterovirus 08/08/2022 NOT DETECTED    Influenza A 08/08/2022 NOT DETECTED    Influenza B 08/08/2022 NOT DETECTED    Parainfluenza Virus 1 08/08/2022 NOT DETECTED    Parainfluenza Virus 2 08/08/2022 NOT DETECTED    Parainfluenza Virus 3 08/08/2022 NOT DETECTED    Parainfluenza Virus 4 08/08/2022 NOT DETECTED    Respiratory Syncytial Vi* 08/08/2022 NOT DETECTED    Bordetella pertussis 08/08/2022 NOT DETECTED    Bordetella Parapertussis 08/08/2022 NOT DETECTED    Chlamydophila pneumoniae 08/08/2022 NOT DETECTED    Mycoplasma pneumoniae 08/08/2022 NOT DETECTED    SARS Coronavirus 2 by RT* 08/08/2022 NEGATIVE    Influenza A by PCR 08/08/2022 NEGATIVE    Influenza B by PCR 08/08/2022 NEGATIVE    Resp Syncytial Virus by * 08/08/2022 NEGATIVE    HIV Screen 4th Generatio* 08/09/2022 Non Reactive    Opiates 08/09/2022 NONE DETECTED    Cocaine 08/09/2022 NONE DETECTED    Benzodiazepines 08/09/2022 POSITIVE (A)    Amphetamines 08/09/2022 NONE DETECTED    Tetrahydrocannabinol 08/09/2022 NONE DETECTED    Barbiturates 08/09/2022 NONE DETECTED    Sodium 08/10/2022 135    Potassium 08/10/2022 3.2 (L)    Chloride 08/10/2022 95 (L)    CO2 08/10/2022 26    Glucose, Bld 08/10/2022 91    BUN 08/10/2022 6    Creatinine, Ser 08/10/2022 0.81    Calcium 08/10/2022 9.1    Total Protein 08/10/2022 6.5    Albumin 08/10/2022 3.1 (L)    AST 08/10/2022 40    ALT 08/10/2022 48 (H)    Alkaline Phosphatase 08/10/2022 78    Total Bilirubin 08/10/2022 2.4 (H)    GFR, Estimated 08/10/2022 >60    Anion gap  08/10/2022 14    WBC 08/10/2022 11.5 (H)    RBC 08/10/2022 4.76    Hemoglobin 08/10/2022 16.7    HCT 08/10/2022 47.8    MCV 08/10/2022 100.4 (H)    MCH 08/10/2022 35.1 (H)    MCHC 08/10/2022 34.9    RDW 08/10/2022 12.5    Platelets 08/10/2022 149 (L)    nRBC 08/10/2022 0.0    Prothrombin Time 08/10/2022 13.7    INR 08/10/2022 1.1    Sodium 08/11/2022 132 (L)    Potassium 08/11/2022 3.0 (L)    Chloride 08/11/2022 96 (L)    CO2 08/11/2022 25    Glucose, Bld 08/11/2022 82    BUN 08/11/2022 9    Creatinine, Ser 08/11/2022 0.91    Calcium 08/11/2022 8.5 (L)    GFR, Estimated 08/11/2022 >60    Anion gap 08/11/2022 11    WBC 08/11/2022 14.5 (H)    RBC 08/11/2022 4.61    Hemoglobin 08/11/2022 16.0    HCT 08/11/2022 46.4    MCV 08/11/2022 100.7 (H)    MCH 08/11/2022 34.7 (H)    MCHC 08/11/2022 34.5    RDW 08/11/2022 12.6    Platelets 08/11/2022 144 (L)    nRBC 08/11/2022 0.0    Magnesium 08/11/2022 1.4 (L)  Procalcitonin 08/11/2022 0.10    WBC 08/12/2022 8.8    RBC 08/12/2022 4.95    Hemoglobin 08/12/2022 17.3 (H)    HCT 08/12/2022 49.6    MCV 08/12/2022 100.2 (H)    MCH 08/12/2022 34.9 (H)    MCHC 08/12/2022 34.9    RDW 08/12/2022 12.6    Platelets 08/12/2022 202    nRBC 08/12/2022 0.0    Sodium 08/12/2022 135    Potassium 08/12/2022 3.1 (L)    Chloride 08/12/2022 100    CO2 08/12/2022 21 (L)    Glucose, Bld 08/12/2022 97    BUN 08/12/2022 10    Creatinine, Ser 08/12/2022 1.04    Calcium 08/12/2022 8.8 (L)    Total Protein 08/12/2022 7.0    Albumin 08/12/2022 3.1 (L)    AST 08/12/2022 33    ALT 08/12/2022 35    Alkaline Phosphatase 08/12/2022 73    Total Bilirubin 08/12/2022 1.7 (H)    GFR, Estimated 08/12/2022 >60    Anion gap 08/12/2022 14    Magnesium 08/12/2022 1.6 (L)    No image results found.   No results found.  US Abdomen Limited RUQ (LIVER/GB)  Result Date: 08/08/2022 CLINICAL DATA:  151471 RUQ pain 151471 EXAM: ULTRASOUND ABDOMEN LIMITED RIGHT  UPPER QUADRANT COMPARISON:  None Available. FINDINGS: Gallbladder: No gallstones, pericholecystic fluid or wall thickening visualized. Common bile duct: Not visualized due to bowel gas. Liver: Liver is hyperechoic consistent with fatty infiltration. No focal hepatic lesions identified. No intrahepatic ductal dilatation. IMPRESSION: Hepatic fatty infiltration. The evaluation was limited by suboptimal visualization. CBD could not be seen. Electronically Signed   By: Layla Maw M.D.   On: 08/08/2022 20:04   CT Angio Chest/Abd/Pel for Dissection W and/or Wo Contrast  Result Date: 08/08/2022 CLINICAL DATA:  Aortic aneurysm suspected abdominal pain EXAM: CT ANGIOGRAPHY CHEST, ABDOMEN AND PELVIS TECHNIQUE: Non-contrast CT of the chest was initially obtained. Multidetector CT imaging through the chest, abdomen and pelvis was performed using the standard protocol during bolus administration of intravenous contrast. Multiplanar reconstructed images and MIPs were obtained and reviewed to evaluate the vascular anatomy. RADIATION DOSE REDUCTION: This exam was performed according to the departmental dose-optimization program which includes automated exposure control, adjustment of the mA and/or kV according to patient size and/or use of iterative reconstruction technique. CONTRAST:  OMNIPAQUE IOHEXOL 350 MG/ML SOLN COMPARISON:  None Available. FINDINGS: CTA CHEST FINDINGS Cardiovascular: Heart is normal size. Aorta is normal caliber. No evidence of aortic dissection. No visible pulmonary embolus. Mediastinum/Nodes: No mediastinal, hilar, or axillary adenopathy. Trachea and esophagus are unremarkable. Thyroid unremarkable. Small hiatal hernia. Lungs/Pleura: Lungs are clear. No focal airspace opacities or suspicious nodules. No effusions. Musculoskeletal: Chest wall soft tissues are unremarkable. No acute bony abnormality. Review of the MIP images confirms the above findings. CTA ABDOMEN AND PELVIS FINDINGS VASCULAR  Aorta: Normal caliber aorta without aneurysm, dissection, vasculitis or significant stenosis. Celiac: Patent without evidence of aneurysm, dissection, vasculitis or significant stenosis. SMA: Patent without evidence of aneurysm, dissection, vasculitis or significant stenosis. Renals: Both renal arteries are patent without evidence of aneurysm, dissection, vasculitis, fibromuscular dysplasia or significant stenosis. IMA: Patent without evidence of aneurysm, dissection, vasculitis or significant stenosis. Inflow: Patent without evidence of aneurysm, dissection, vasculitis or significant stenosis. Veins: No obvious venous abnormality within the limitations of this arterial phase study. Review of the MIP images confirms the above findings. NON-VASCULAR Hepatobiliary: Diffuse low-density throughout the liver compatible with fatty infiltration. No focal abnormality. Gallbladder unremarkable. Pancreas: No focal  abnormality or ductal dilatation. Spleen: No focal abnormality.  Normal size. Adrenals/Urinary Tract: No adrenal abnormality. No focal renal abnormality. No stones or hydronephrosis. Urinary bladder is unremarkable. Stomach/Bowel: Normal appendix. Stomach, large and small bowel grossly unremarkable. Lymphatic: No adenopathy Reproductive: No visible focal abnormality. Other: No free fluid or free air. Musculoskeletal: No acute bony abnormality. Review of the MIP images confirms the above findings. IMPRESSION: No evidence of aortic aneurysm or dissection. No evidence of pulmonary embolus. No acute findings in the chest, abdomen or pelvis. Small hiatal hernia. Hepatic steatosis. Electronically Signed   By: Charlett Nose M.D.   On: 08/08/2022 18:16       Additional Info: This encounter employed real-time, collaborative documentation. The patient actively reviewed and updated their medical record on a shared screen, ensuring transparency and facilitating joint problem-solving for the problem list, overview, and plan.  This approach promotes accurate, informed care. The treatment plan was discussed and reviewed in detail, including medication safety, potential side effects, and all patient questions. We confirmed understanding and comfort with the plan. Follow-up instructions were established, including contacting the office for any concerns, returning if symptoms worsen, persist, or new symptoms develop, and precautions for potential emergency department visits.

## 2023-05-14 NOTE — Patient Instructions (Addendum)
VISIT SUMMARY:  During your visit, we discussed your recent symptoms of dark urine, yellowing of the eyes, and nausea, which may be related to your history of liver disease. We also reviewed your chronic conditions, including back pain, high blood pressure, depression, and anxiety, and your recent weight loss. We discussed your concerns about your diet and the use of artificial sweeteners.  YOUR PLAN:  -LIVER DISEASE: Your recent symptoms may indicate early liver failure, a condition where the liver is not able to perform its normal functions. We will conduct blood tests to assess your liver health and start you on Zofran for nausea. We will also refer you to a liver specialist for further management. Continue avoiding alcohol and maintain a healthy diet.  -DEPRESSION/ANXIETY: Your depression and anxiety are currently managed with Wellbutrin, a medication that helps balance certain natural substances in the brain. We will refill your prescription.  -CHRONIC BACK PAIN: Your back pain is being managed with Celebrex, a medication that reduces pain and inflammation. Continue taking Celebrex as needed.  -HYPERTENSION: Your high blood pressure is being managed with Amlodipine, a medication that relaxes blood vessels so blood can flow more easily. Continue taking Amlodipine daily.  -WEIGHT MANAGEMENT: You have made significant progress in your weight loss journey, transitioning from obesity to overweight. Keep up the good work with your healthy lifestyle habits.  -TOBACCO AND CANNABIS USE: You have successfully quit both tobacco and cannabis, which is great for your overall health. Keep up the good work and continue to abstain from these substances.  -GASTROESOPHAGEAL REFLUX DISEASE (GERD): You have stopped taking medication for GERD, a condition where stomach acid frequently flows back into the tube connecting your mouth and stomach. Monitor your symptoms and consider restarting medication if symptoms  persist.  INSTRUCTIONS:  Please get the blood tests done as soon as possible. Continue taking your medications as prescribed and maintain your healthy lifestyle habits. Monitor your GERD symptoms and consider restarting medication if they persist. We will arrange a referral to a liver specialist for further management.    -MEDICATION REFILL: We will refill your prescription for Wellbutrin, a medication used to treat depression, at the same dosage and frequency as before.  From consensus.com (the best place to get your medical data)  Introduction Liver failure, particularly in patients who have quit drinking, requires careful dietary management to support liver function and overall health. Various studies have explored the impact of different foods and dietary patterns on liver health, particularly focusing on non-alcoholic fatty liver disease (NAFLD) and alcohol-induced hepatotoxicity.  Key Insights Plant-Based Foods and Antioxidants:  Foods like garlic, turmeric, [place] gooseberry, soybeans, curry leaves, apricots, wild basil, cocoa, fenugreek, and grapes have shown protective effects against ethanol-induced liver damage due to their antioxidant, anti-inflammatory, and anti-fibrotic properties  . A plant-based, high-fiber, and low-fat diet is beneficial for managing fatty liver disease, with specific bioactive compounds like resveratrol, anthocyanin, curcumin, and tea polyphenols playing a significant role in alleviating liver conditions  Mediterranean Diet:  The Mediterranean diet, which emphasizes plant-based foods, fish, and reduced consumption of meat and dairy, is highly recommended for managing NAFLD. This diet is rich in antioxidants and anti-inflammatory compounds, which help reduce liver fat and improve liver function . Specific Foods and Nutrients:  Oily fish, coffee, nuts, tea, red wine, avocado, and olive oil have shown promise in protecting against NAFLD and improving liver  health. These foods are associated with reduced liver fat and improved metabolic profiles  Nuts, in particular, have  been negatively associated with the likelihood of developing NAFLD, suggesting their protective role in liver health  Avoidance of Harmful Foods:  High consumption of red meat, processed meat, saturated fats, added sugars, and sweetened beverages is linked to an increased risk of NAFLD and should be avoided  Caloric Restriction and Weight Management:  Caloric restriction and weight loss are crucial for managing NAFLD. Reducing caloric intake, particularly from cholesterol and saturated fats, and avoiding simple sugars and soft drinks are effective strategies  Conclusion For patients with liver failure who have quit drinking, a diet rich in plant-based foods, antioxidants, and healthy fats is beneficial. The Mediterranean diet stands out as a highly recommended dietary pattern due to its comprehensive health benefits. Specific foods like oily fish, coffee, nuts, and olive oil can further support liver health. Conversely, avoiding red meat, processed foods, and sugary beverages is crucial. Caloric restriction and weight management are also essential components of dietary strategies for improving liver function.

## 2023-05-15 LAB — BILIRUBIN, FRACTIONATED(TOT/DIR/INDIR)
Bilirubin, Direct: 3 mg/dL — ABNORMAL HIGH (ref 0.0–0.2)
Indirect Bilirubin: 1.4 mg/dL — ABNORMAL HIGH (ref 0.2–1.2)
Total Bilirubin: 4.4 mg/dL — ABNORMAL HIGH (ref 0.2–1.2)

## 2023-05-15 LAB — LIPID PANEL W/REFLEX DIRECT LDL
Cholesterol: 164 mg/dL (ref <200)
HDL: 48 mg/dL (ref >=40)
LDL Cholesterol (Calc): 97 mg/dL
Non-HDL Cholesterol (Calc): 116 mg/dL (ref <130)
Total CHOL/HDL Ratio: 3.4 (calc) (ref <5.0)
Triglycerides: 101 mg/dL (ref <150)

## 2023-05-15 LAB — TSH RFX ON ABNORMAL TO FREE T4: TSH: 1.14 u[IU]/mL (ref 0.450–4.500)

## 2023-05-18 ENCOUNTER — Telehealth: Payer: Self-pay | Admitting: Internal Medicine

## 2023-05-18 ENCOUNTER — Encounter: Payer: Self-pay | Admitting: Internal Medicine

## 2023-05-18 DIAGNOSIS — F32A Depression, unspecified: Secondary | ICD-10-CM | POA: Insufficient documentation

## 2023-05-18 DIAGNOSIS — K709 Alcoholic liver disease, unspecified: Secondary | ICD-10-CM | POA: Insufficient documentation

## 2023-05-18 NOTE — Addendum Note (Signed)
Addended by: Lula Olszewski on: 05/18/2023 08:12 PM   Modules accepted: Orders

## 2023-05-18 NOTE — Telephone Encounter (Signed)
Pt would like a call back with lab results 

## 2023-05-20 ENCOUNTER — Encounter (HOSPITAL_BASED_OUTPATIENT_CLINIC_OR_DEPARTMENT_OTHER): Payer: Self-pay | Admitting: Emergency Medicine

## 2023-05-20 ENCOUNTER — Inpatient Hospital Stay (HOSPITAL_BASED_OUTPATIENT_CLINIC_OR_DEPARTMENT_OTHER)
Admission: EM | Admit: 2023-05-20 | Discharge: 2023-05-23 | DRG: 441 | Disposition: A | Discharge status: Home / Self Care | Payer: 59 | Attending: Internal Medicine | Admitting: Internal Medicine

## 2023-05-20 ENCOUNTER — Emergency Department (HOSPITAL_BASED_OUTPATIENT_CLINIC_OR_DEPARTMENT_OTHER): Payer: 59

## 2023-05-20 ENCOUNTER — Other Ambulatory Visit: Payer: Self-pay

## 2023-05-20 ENCOUNTER — Encounter: Payer: Self-pay | Admitting: Gastroenterology

## 2023-05-20 DIAGNOSIS — R590 Localized enlarged lymph nodes: Secondary | ICD-10-CM | POA: Diagnosis present

## 2023-05-20 DIAGNOSIS — Z833 Family history of diabetes mellitus: Secondary | ICD-10-CM

## 2023-05-20 DIAGNOSIS — K716 Toxic liver disease with hepatitis, not elsewhere classified: Secondary | ICD-10-CM | POA: Diagnosis not present

## 2023-05-20 DIAGNOSIS — Z79899 Other long term (current) drug therapy: Secondary | ICD-10-CM

## 2023-05-20 DIAGNOSIS — Z818 Family history of other mental and behavioral disorders: Secondary | ICD-10-CM

## 2023-05-20 DIAGNOSIS — G8929 Other chronic pain: Secondary | ICD-10-CM | POA: Diagnosis present

## 2023-05-20 DIAGNOSIS — R7401 Elevation of levels of liver transaminase levels: Secondary | ICD-10-CM | POA: Insufficient documentation

## 2023-05-20 DIAGNOSIS — R17 Unspecified jaundice: Principal | ICD-10-CM | POA: Diagnosis present

## 2023-05-20 DIAGNOSIS — Y92009 Unspecified place in unspecified non-institutional (private) residence as the place of occurrence of the external cause: Secondary | ICD-10-CM

## 2023-05-20 DIAGNOSIS — E871 Hypo-osmolality and hyponatremia: Secondary | ICD-10-CM | POA: Diagnosis present

## 2023-05-20 DIAGNOSIS — K859 Acute pancreatitis without necrosis or infection, unspecified: Secondary | ICD-10-CM

## 2023-05-20 DIAGNOSIS — I10 Essential (primary) hypertension: Secondary | ICD-10-CM | POA: Diagnosis present

## 2023-05-20 DIAGNOSIS — Z825 Family history of asthma and other chronic lower respiratory diseases: Secondary | ICD-10-CM

## 2023-05-20 DIAGNOSIS — F411 Generalized anxiety disorder: Secondary | ICD-10-CM | POA: Insufficient documentation

## 2023-05-20 DIAGNOSIS — T39395A Adverse effect of other nonsteroidal anti-inflammatory drugs [NSAID], initial encounter: Secondary | ICD-10-CM | POA: Diagnosis present

## 2023-05-20 DIAGNOSIS — E878 Other disorders of electrolyte and fluid balance, not elsewhere classified: Secondary | ICD-10-CM | POA: Diagnosis present

## 2023-05-20 DIAGNOSIS — R1013 Epigastric pain: Secondary | ICD-10-CM | POA: Insufficient documentation

## 2023-05-20 DIAGNOSIS — F17210 Nicotine dependence, cigarettes, uncomplicated: Secondary | ICD-10-CM | POA: Diagnosis present

## 2023-05-20 DIAGNOSIS — R748 Abnormal levels of other serum enzymes: Secondary | ICD-10-CM | POA: Diagnosis present

## 2023-05-20 DIAGNOSIS — Z8249 Family history of ischemic heart disease and other diseases of the circulatory system: Secondary | ICD-10-CM

## 2023-05-20 DIAGNOSIS — T50995A Adverse effect of other drugs, medicaments and biological substances, initial encounter: Secondary | ICD-10-CM | POA: Diagnosis present

## 2023-05-20 DIAGNOSIS — R7989 Other specified abnormal findings of blood chemistry: Secondary | ICD-10-CM | POA: Diagnosis present

## 2023-05-20 DIAGNOSIS — E875 Hyperkalemia: Secondary | ICD-10-CM | POA: Diagnosis present

## 2023-05-20 DIAGNOSIS — K529 Noninfective gastroenteritis and colitis, unspecified: Secondary | ICD-10-CM | POA: Diagnosis present

## 2023-05-20 DIAGNOSIS — K5000 Crohn's disease of small intestine without complications: Secondary | ICD-10-CM

## 2023-05-20 DIAGNOSIS — Z823 Family history of stroke: Secondary | ICD-10-CM

## 2023-05-20 DIAGNOSIS — Z811 Family history of alcohol abuse and dependence: Secondary | ICD-10-CM

## 2023-05-20 DIAGNOSIS — R948 Abnormal results of function studies of other organs and systems: Secondary | ICD-10-CM | POA: Diagnosis present

## 2023-05-20 HISTORY — DX: Essential (primary) hypertension: I10

## 2023-05-20 LAB — CBC
HCT: 45.6 % (ref 39.0–52.0)
Hemoglobin: 15.2 g/dL (ref 13.0–17.0)
MCH: 29.6 pg (ref 26.0–34.0)
MCHC: 33.3 g/dL (ref 30.0–36.0)
MCV: 88.7 fL (ref 80.0–100.0)
Platelets: 261 10*3/uL (ref 150–400)
RBC: 5.14 MIL/uL (ref 4.22–5.81)
RDW: 13.5 % (ref 11.5–15.5)
WBC: 10.7 10*3/uL — ABNORMAL HIGH (ref 4.0–10.5)
nRBC: 0 % (ref 0.0–0.2)

## 2023-05-20 LAB — URINALYSIS, ROUTINE W REFLEX MICROSCOPIC
Bilirubin Urine: NEGATIVE
Glucose, UA: NEGATIVE mg/dL
Hgb urine dipstick: NEGATIVE
Ketones, ur: NEGATIVE mg/dL
Leukocytes,Ua: NEGATIVE
Nitrite: NEGATIVE
Protein, ur: NEGATIVE mg/dL
Specific Gravity, Urine: 1.008 (ref 1.005–1.030)
pH: 6 (ref 5.0–8.0)

## 2023-05-20 LAB — COMPREHENSIVE METABOLIC PANEL
ALT: 1166 U/L — ABNORMAL HIGH (ref 0–44)
AST: 538 U/L — ABNORMAL HIGH (ref 15–41)
Albumin: 4.2 g/dL (ref 3.5–5.0)
Alkaline Phosphatase: 242 U/L — ABNORMAL HIGH (ref 38–126)
Anion gap: 8 (ref 5–15)
BUN: 14 mg/dL (ref 6–20)
CO2: 29 mmol/L (ref 22–32)
Calcium: 10 mg/dL (ref 8.9–10.3)
Chloride: 97 mmol/L — ABNORMAL LOW (ref 98–111)
Creatinine, Ser: 0.95 mg/dL (ref 0.61–1.24)
GFR, Estimated: >60 mL/min (ref >60)
Glucose, Bld: 98 mg/dL (ref 70–99)
Potassium: 4 mmol/L (ref 3.5–5.1)
Sodium: 134 mmol/L — ABNORMAL LOW (ref 135–145)
Total Bilirubin: 5.4 mg/dL — ABNORMAL HIGH (ref 0.3–1.2)
Total Protein: 8.2 g/dL — ABNORMAL HIGH (ref 6.5–8.1)

## 2023-05-20 LAB — HEPATITIS PANEL, ACUTE
HCV Ab: NONREACTIVE
Hep A IgM: NONREACTIVE
Hep B C IgM: NONREACTIVE
Hepatitis B Surface Ag: NONREACTIVE

## 2023-05-20 LAB — ACETAMINOPHEN LEVEL: Acetaminophen (Tylenol), Serum: <10 ug/mL — ABNORMAL LOW (ref 10–30)

## 2023-05-20 LAB — ENHANCED LIVER FIBROSIS (ELF): ELF(TM) Score: 9.59 (ref <9.80)

## 2023-05-20 LAB — FIB-4 W/REFLEX TO ELF
ALT: 668 [IU]/L (ref 0–44)
AST: 234 [IU]/L — ABNORMAL HIGH (ref 0–40)
FIB-4 Index: 1.42 (ref 0.00–2.67)
Platelets: 249 10*3/uL (ref 150–450)

## 2023-05-20 LAB — LIPASE, BLOOD: Lipase: 134 U/L — ABNORMAL HIGH (ref 11–51)

## 2023-05-20 MED ORDER — SODIUM CHLORIDE 0.9 % IV BOLUS
1000.0000 mL | Freq: Once | INTRAVENOUS | Status: AC
  Administered 2023-05-20: 1000 mL via INTRAVENOUS

## 2023-05-20 MED ORDER — IOHEXOL 300 MG/ML  SOLN
100.0000 mL | Freq: Once | INTRAMUSCULAR | Status: AC | PRN
Start: 2023-05-20 — End: 2023-05-20
  Administered 2023-05-20: 100 mL via INTRAVENOUS

## 2023-05-20 NOTE — ED Triage Notes (Signed)
Pt c/o x epigastric pain with n/v x 2 weeks and jaundice x 6 days

## 2023-05-20 NOTE — ED Provider Notes (Signed)
[place] EMERGENCY DEPARTMENT AT [facility] Provider Note   CSN: [id number] Arrival date & time: 05/20/23  1642     History Chief Complaint  Patient presents with   Abdominal Pain   Jaundice    Stephen Reid is a 37 y.o. male patient who presents to the emergency department today for further evaluation of epigastric and right upper quadrant abdominal pain that is been bothering him for some time but worse intermittently over the last couple weeks.  Patient noticed that he has been more jaundiced than normal.  He has not had any alcohol since late last year.  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  He currently denies fever, chills, diarrhea. Denies Tylenol.   Patient was seen evaluated by his PCP on 05/14/2023 for dark urine, scleral icterus and nausea.  They discussed at that time potential for early liver failure and ordered some blood work.  I reviewed his blood work and there was evidence of transaminitis at that time.   Abdominal Pain      Home Medications Prior to Admission medications   Medication Sig Start Date End Date Taking? Authorizing Provider  amLODipine (NORVASC) 5 MG tablet Take 1 tablet (5 mg total) by mouth daily. Ok to stop if blood pressure less than 140/90 08/28/22 11/26/22  Lula Olszewski, MD  buPROPion ER [facility] SR) 100 MG 12 hr tablet Take 1 tablet (100 mg total) by mouth daily. Start at full tablet daily due to it cannot be split in half 05/14/23   Lula Olszewski, MD  celecoxib (CELEBREX) 100 MG capsule Take 1 capsule (100 mg total) by mouth 2 (two) times daily. 05/14/23   Lula Olszewski, MD  ondansetron (ZOFRAN-ODT) 4 MG disintegrating tablet Take 1 tablet (4 mg total) by mouth every 4 (four) hours as needed for nausea or vomiting. 05/14/23   Lula Olszewski, MD      Allergies    Patient has no known  allergies.    Review of Systems   Review of Systems  Gastrointestinal:  Positive for abdominal pain.  All other systems reviewed and are negative.   Physical Exam Updated Vital Signs BP (!) 152/104   Pulse (!) 104   Temp 98.1 F (36.7 C) (Oral)   Resp 18   Wt 88 kg   SpO2 99%   BMI 27.06 kg/m  Physical Exam Vitals and nursing note reviewed.  Constitutional:      General: He is not in acute distress.    Appearance: Normal appearance.  HENT:     Head: Normocephalic and atraumatic.  Eyes:     General:        Right eye: No discharge.        Left eye: No discharge.     Comments: Bilateral scleral icterus.  Cardiovascular:     Rate and Rhythm: Regular rhythm. Tachycardia present.     Pulses: No decreased pulses.     Heart sounds: Normal heart sounds.  Pulmonary:     Comments: Clear to auscultation bilaterally.  Normal effort.  No respiratory distress.  No evidence of wheezes, rales, or rhonchi heard throughout. Abdominal:     General: Abdomen is flat. Bowel sounds are normal. There is no distension.     Tenderness: There is abdominal tenderness in the right upper quadrant and epigastric area. There is no guarding or rebound.  Musculoskeletal:  General: Normal range of motion.     Cervical back: Neck supple.  Skin:    General: Skin is warm and dry.     Findings: No rash.  Neurological:     General: No focal deficit present.     Mental Status: He is alert.  Psychiatric:        Mood and Affect: Mood normal.        Behavior: Behavior normal.     ED Results / Procedures / Treatments   Labs (all labs ordered are listed, but only abnormal results are displayed) Labs Reviewed  LIPASE, BLOOD - Abnormal; Notable for the following components:      Result Value   Lipase 134 (*)    All other components within normal limits  COMPREHENSIVE METABOLIC PANEL - Abnormal; Notable for the following components:   Sodium 134 (*)    Chloride 97 (*)    Total Protein 8.2 (*)     AST 538 (*)    ALT 1,166 (*)    Alkaline Phosphatase 242 (*)    Total Bilirubin 5.4 (*)    All other components within normal limits  CBC - Abnormal; Notable for the following components:   WBC 10.7 (*)    All other components within normal limits  URINALYSIS, ROUTINE W REFLEX MICROSCOPIC  HEPATITIS PANEL, ACUTE  ACETAMINOPHEN LEVEL    EKG EKG Interpretation Date/Time:  Thursday May 20 2023 17:18:43 EDT Ventricular Rate:  82 PR Interval:  148 QRS Duration:  88 QT Interval:  368 QTC Calculation: 429 R Axis:   61  Text Interpretation: Normal sinus rhythm Cannot rule out Anterior infarct (cited on or before 08-Aug-2022) Abnormal ECG When compared with ECG of 08-Aug-2022 14:29, QT has shortened when comapred to prior, similar appearance. NO STEMI Confirmed by Theda Belfast ([id number]) on 05/20/2023 5:22:40 PM  Radiology No results found.  Procedures Procedures    Medications Ordered in ED Medications  sodium chloride 0.9 % bolus 1,000 mL (has no administration in time range)    ED Course/ Medical Decision Making/ A&P Clinical Course as of 05/20/23 1902  Thu May 20, 2023  1851 CBC(!) There is evidence of leukocytosis.  [CF]  1851 Comprehensive metabolic panel(!) Mild hyponatremia and mild hypochloremia.  There is significant transaminitis and hyperbilirubinemia. [CF]  1852 Lipase, blood(!) Elevated.  [CF]    Clinical Course User Index [CF] Teressa Lower, PA-C   {   Click here for ABCD2, HEART and other calculators  Medical Decision Making Stephen Reid is a 37 y.o. male patient who presents to the emergency department today for further evaluation of jaundice and upper abdominal pain.  Concern that the patient is tachycardic and does have evidence of leukocytosis and transaminitis I will plan to get a CT scan with contrast to further assess liver and gallbladder pathology.  Patient's pain is currently controlled.  CT scan is still pending. Hepatitis and  tylenol levels pending. If CT scan reveals any gallbladder pathology can act accordingly.  If the CT scan is negative patient might need a consult to gastroenterology for possible choledocholithiasis pathology.  Overall, patient is resting comfortably in the emergency department.  Due to shift change, the rest of his care will be transferred to oncoming provider where ultimate disposition will be made.   Amount and/or Complexity of Data Reviewed Labs: ordered. Decision-making details documented in ED Course. Radiology: ordered.    Final Clinical Impression(s) / ED Diagnoses Final diagnoses:  None    Rx /  DC Orders ED Discharge Orders     None         Jolyn Lent 05/20/23 1902    Tegeler, Canary Brim, MD 05/20/23 Izell [place]

## 2023-05-20 NOTE — ED Provider Notes (Signed)
Patient given in sign out by Honor Loh, PA-C.  Please review their note for patient HPI, physical exam, workup.  At this time the plan is follow-up on labs and imaging as patient most likely has choledocholithiasis.  CT scan shows pancreatitis along with terminal ileitis however no stone in the hepatobiliary system.  Patient is asymptomatic in the room besides being icterus with jaundice.  I spoke to the GI on-call who states that patient can be admitted to medicine that they will see him in the morning as patient does not have dilated pancreatic duct and does not need MRCP at this time.  Will consult hospitalist at this time.  Consults: Marina Goodell, MD GI  Hospitalist has been consulted twice and it has been 2 and half hours since the original consult order was placed. Patient signed out to Bernette Mayers, MD.  Please review their note for the continuation of patient's care.  The plan at this point is admit for transaminitis and that GI will see in the morning. Does not need MRCP at this time. Patient stable at this time.     Netta Corrigan, PA-C 05/21/23 0005    Reid, Stephen Brim, MD 05/26/23 [phone]

## 2023-05-20 NOTE — ED Notes (Signed)
ED Provider at bedside. 

## 2023-05-20 NOTE — ED Notes (Signed)
Patient transported to CT 

## 2023-05-21 ENCOUNTER — Encounter (HOSPITAL_COMMUNITY): Payer: Self-pay | Admitting: Internal Medicine

## 2023-05-21 DIAGNOSIS — K5000 Crohn's disease of small intestine without complications: Secondary | ICD-10-CM

## 2023-05-21 DIAGNOSIS — R7401 Elevation of levels of liver transaminase levels: Secondary | ICD-10-CM

## 2023-05-21 DIAGNOSIS — Z823 Family history of stroke: Secondary | ICD-10-CM | POA: Diagnosis not present

## 2023-05-21 DIAGNOSIS — R1013 Epigastric pain: Secondary | ICD-10-CM | POA: Insufficient documentation

## 2023-05-21 DIAGNOSIS — R7989 Other specified abnormal findings of blood chemistry: Secondary | ICD-10-CM | POA: Diagnosis present

## 2023-05-21 DIAGNOSIS — R590 Localized enlarged lymph nodes: Secondary | ICD-10-CM | POA: Diagnosis present

## 2023-05-21 DIAGNOSIS — Z825 Family history of asthma and other chronic lower respiratory diseases: Secondary | ICD-10-CM | POA: Diagnosis not present

## 2023-05-21 DIAGNOSIS — E871 Hypo-osmolality and hyponatremia: Secondary | ICD-10-CM | POA: Diagnosis present

## 2023-05-21 DIAGNOSIS — K716 Toxic liver disease with hepatitis, not elsewhere classified: Secondary | ICD-10-CM | POA: Diagnosis present

## 2023-05-21 DIAGNOSIS — I10 Essential (primary) hypertension: Secondary | ICD-10-CM | POA: Diagnosis present

## 2023-05-21 DIAGNOSIS — F17210 Nicotine dependence, cigarettes, uncomplicated: Secondary | ICD-10-CM | POA: Diagnosis present

## 2023-05-21 DIAGNOSIS — R17 Unspecified jaundice: Secondary | ICD-10-CM

## 2023-05-21 DIAGNOSIS — Z818 Family history of other mental and behavioral disorders: Secondary | ICD-10-CM | POA: Diagnosis not present

## 2023-05-21 DIAGNOSIS — Z811 Family history of alcohol abuse and dependence: Secondary | ICD-10-CM | POA: Diagnosis not present

## 2023-05-21 DIAGNOSIS — E878 Other disorders of electrolyte and fluid balance, not elsewhere classified: Secondary | ICD-10-CM | POA: Diagnosis present

## 2023-05-21 DIAGNOSIS — R748 Abnormal levels of other serum enzymes: Secondary | ICD-10-CM | POA: Diagnosis present

## 2023-05-21 DIAGNOSIS — Z79899 Other long term (current) drug therapy: Secondary | ICD-10-CM | POA: Diagnosis not present

## 2023-05-21 DIAGNOSIS — Y92009 Unspecified place in unspecified non-institutional (private) residence as the place of occurrence of the external cause: Secondary | ICD-10-CM | POA: Diagnosis not present

## 2023-05-21 DIAGNOSIS — T50995A Adverse effect of other drugs, medicaments and biological substances, initial encounter: Secondary | ICD-10-CM | POA: Diagnosis present

## 2023-05-21 DIAGNOSIS — K859 Acute pancreatitis without necrosis or infection, unspecified: Secondary | ICD-10-CM

## 2023-05-21 DIAGNOSIS — K529 Noninfective gastroenteritis and colitis, unspecified: Secondary | ICD-10-CM | POA: Diagnosis present

## 2023-05-21 DIAGNOSIS — Z833 Family history of diabetes mellitus: Secondary | ICD-10-CM | POA: Diagnosis not present

## 2023-05-21 DIAGNOSIS — F411 Generalized anxiety disorder: Secondary | ICD-10-CM | POA: Insufficient documentation

## 2023-05-21 DIAGNOSIS — E875 Hyperkalemia: Secondary | ICD-10-CM | POA: Diagnosis present

## 2023-05-21 DIAGNOSIS — Z8249 Family history of ischemic heart disease and other diseases of the circulatory system: Secondary | ICD-10-CM | POA: Diagnosis not present

## 2023-05-21 DIAGNOSIS — T39395A Adverse effect of other nonsteroidal anti-inflammatory drugs [NSAID], initial encounter: Secondary | ICD-10-CM | POA: Diagnosis present

## 2023-05-21 DIAGNOSIS — G8929 Other chronic pain: Secondary | ICD-10-CM | POA: Diagnosis present

## 2023-05-21 HISTORY — DX: Elevation of levels of liver transaminase levels: R74.01

## 2023-05-21 HISTORY — DX: Crohn's disease of small intestine without complications: K50.00

## 2023-05-21 HISTORY — DX: Unspecified jaundice: R17

## 2023-05-21 HISTORY — DX: Acute pancreatitis without necrosis or infection, unspecified: K85.90

## 2023-05-21 LAB — FERRITIN: Ferritin: 952 ng/mL — ABNORMAL HIGH (ref 24–336)

## 2023-05-21 LAB — CBC WITH DIFFERENTIAL/PLATELET
Abs Immature Granulocytes: 0.04 10*3/uL (ref 0.00–0.07)
Basophils Absolute: 0 10*3/uL (ref 0.0–0.1)
Basophils Relative: 0 %
Eosinophils Absolute: 0.2 10*3/uL (ref 0.0–0.5)
Eosinophils Relative: 2 %
HCT: 42.8 % (ref 39.0–52.0)
Hemoglobin: 14.1 g/dL (ref 13.0–17.0)
Immature Granulocytes: 1 %
Lymphocytes Relative: 18 %
Lymphs Abs: 1.5 10*3/uL (ref 0.7–4.0)
MCH: 29.5 pg (ref 26.0–34.0)
MCHC: 32.9 g/dL (ref 30.0–36.0)
MCV: 89.5 fL (ref 80.0–100.0)
Monocytes Absolute: 0.6 10*3/uL (ref 0.1–1.0)
Monocytes Relative: 7 %
Neutro Abs: 6.1 10*3/uL (ref 1.7–7.7)
Neutrophils Relative %: 72 %
Platelets: 275 10*3/uL (ref 150–400)
RBC: 4.78 MIL/uL (ref 4.22–5.81)
RDW: 13.4 % (ref 11.5–15.5)
WBC: 8.5 10*3/uL (ref 4.0–10.5)
nRBC: 0 % (ref 0.0–0.2)

## 2023-05-21 LAB — COMPREHENSIVE METABOLIC PANEL
ALT: 1220 U/L — ABNORMAL HIGH (ref 0–44)
ALT: 1270 U/L — ABNORMAL HIGH (ref 0–44)
AST: 635 U/L — ABNORMAL HIGH (ref 15–41)
AST: 677 U/L — ABNORMAL HIGH (ref 15–41)
Albumin: 3.5 g/dL (ref 3.5–5.0)
Albumin: 3.5 g/dL (ref 3.5–5.0)
Alkaline Phosphatase: 213 U/L — ABNORMAL HIGH (ref 38–126)
Alkaline Phosphatase: 220 U/L — ABNORMAL HIGH (ref 38–126)
Anion gap: 9 (ref 5–15)
Anion gap: 9 (ref 5–15)
BUN: 9 mg/dL (ref 6–20)
BUN: 9 mg/dL (ref 6–20)
CO2: 27 mmol/L (ref 22–32)
CO2: 27 mmol/L (ref 22–32)
Calcium: 10 mg/dL (ref 8.9–10.3)
Calcium: 9.9 mg/dL (ref 8.9–10.3)
Chloride: 102 mmol/L (ref 98–111)
Chloride: 102 mmol/L (ref 98–111)
Creatinine, Ser: 0.91 mg/dL (ref 0.61–1.24)
Creatinine, Ser: 0.94 mg/dL (ref 0.61–1.24)
GFR, Estimated: >60 mL/min (ref >60)
GFR, Estimated: >60 mL/min (ref >60)
Glucose, Bld: 109 mg/dL — ABNORMAL HIGH (ref 70–99)
Glucose, Bld: 110 mg/dL — ABNORMAL HIGH (ref 70–99)
Potassium: 5.4 mmol/L — ABNORMAL HIGH (ref 3.5–5.1)
Potassium: 5.6 mmol/L — ABNORMAL HIGH (ref 3.5–5.1)
Sodium: 138 mmol/L (ref 135–145)
Sodium: 138 mmol/L (ref 135–145)
Total Bilirubin: 4.3 mg/dL — ABNORMAL HIGH (ref 0.3–1.2)
Total Bilirubin: 4.4 mg/dL — ABNORMAL HIGH (ref 0.3–1.2)
Total Protein: 6.8 g/dL (ref 6.5–8.1)
Total Protein: 7.4 g/dL (ref 6.5–8.1)

## 2023-05-21 LAB — RETICULOCYTES
Immature Retic Fract: 6.4 % (ref 2.3–15.9)
RBC.: 4.78 MIL/uL (ref 4.22–5.81)
Retic Count, Absolute: 61.2 10*3/uL (ref 19.0–186.0)
Retic Ct Pct: 1.3 % (ref 0.4–3.1)

## 2023-05-21 LAB — IRON AND TIBC
Iron: 101 ug/dL (ref 45–182)
Saturation Ratios: 29 % (ref 17.9–39.5)
TIBC: 350 ug/dL (ref 250–450)
UIBC: 249 ug/dL

## 2023-05-21 LAB — PROTIME-INR
INR: 1 (ref 0.8–1.2)
Prothrombin Time: 12.9 s (ref 11.4–15.2)

## 2023-05-21 LAB — LIPASE, BLOOD: Lipase: 103 U/L — ABNORMAL HIGH (ref 11–51)

## 2023-05-21 LAB — VITAMIN B12: Vitamin B-12: 368 pg/mL (ref 180–914)

## 2023-05-21 LAB — C-REACTIVE PROTEIN: CRP: 3.5 mg/dL — ABNORMAL HIGH (ref <1.0)

## 2023-05-21 LAB — FOLATE: Folate: 23.7 ng/mL (ref >5.9)

## 2023-05-21 LAB — MAGNESIUM: Magnesium: 2 mg/dL (ref 1.7–2.4)

## 2023-05-21 LAB — HIV ANTIBODY (ROUTINE TESTING W REFLEX): HIV Screen 4th Generation wRfx: NONREACTIVE

## 2023-05-21 MED ORDER — PANTOPRAZOLE SODIUM 40 MG IV SOLR
40.0000 mg | INTRAVENOUS | Status: DC
  Administered 2023-05-21: 40 mg via INTRAVENOUS
  Filled 2023-05-21: qty 10

## 2023-05-21 MED ORDER — SODIUM CHLORIDE 0.9% FLUSH
3.0000 mL | Freq: Two times a day (BID) | INTRAVENOUS | Status: DC
  Administered 2023-05-21 (×2): 3 mL via INTRAVENOUS

## 2023-05-21 MED ORDER — SODIUM CHLORIDE 0.9% FLUSH
3.0000 mL | INTRAVENOUS | Status: DC | PRN
Start: 2023-05-21 — End: 2023-05-21

## 2023-05-21 MED ORDER — FUROSEMIDE 40 MG PO TABS
20.0000 mg | ORAL_TABLET | Freq: Once | ORAL | Status: AC
  Administered 2023-05-21: 20 mg via ORAL
  Filled 2023-05-21: qty 1

## 2023-05-21 MED ORDER — METRONIDAZOLE 500 MG/100ML IV SOLN
500.0000 mg | Freq: Two times a day (BID) | INTRAVENOUS | Status: DC
  Administered 2023-05-21: 500 mg via INTRAVENOUS
  Filled 2023-05-21: qty 100

## 2023-05-21 MED ORDER — PANTOPRAZOLE SODIUM 40 MG PO TBEC
40.0000 mg | DELAYED_RELEASE_TABLET | Freq: Every day | ORAL | Status: DC
  Administered 2023-05-22 – 2023-05-23 (×2): 40 mg via ORAL
  Filled 2023-05-21 (×2): qty 1

## 2023-05-21 MED ORDER — ACETAMINOPHEN 325 MG PO TABS
650.0000 mg | ORAL_TABLET | Freq: Four times a day (QID) | ORAL | Status: DC | PRN
Start: 2023-05-21 — End: 2023-05-21

## 2023-05-21 MED ORDER — BUPROPION HCL ER (SR) 100 MG PO TB12
100.0000 mg | ORAL_TABLET | Freq: Every day | ORAL | Status: DC
  Administered 2023-05-21: 100 mg via ORAL
  Filled 2023-05-21: qty 1

## 2023-05-21 MED ORDER — ALUM & MAG HYDROXIDE-SIMETH 200-200-20 MG/5ML PO SUSP: 15.0000 mL | Freq: Four times a day (QID) | ORAL | Status: DC | PRN

## 2023-05-21 MED ORDER — HEPARIN SODIUM (PORCINE) 5000 UNIT/ML IJ SOLN
5000.0000 [IU] | Freq: Three times a day (TID) | INTRAMUSCULAR | Status: DC
Start: 2023-05-21 — End: 2023-05-23
  Administered 2023-05-21 – 2023-05-23 (×6): 5000 [IU] via SUBCUTANEOUS
  Filled 2023-05-21 (×7): qty 1

## 2023-05-21 MED ORDER — TRAMADOL HCL 50 MG PO TABS: 50.0000 mg | ORAL_TABLET | Freq: Four times a day (QID) | ORAL | Status: DC | PRN

## 2023-05-21 MED ORDER — ONDANSETRON HCL 4 MG PO TABS: 4.0000 mg | ORAL_TABLET | Freq: Four times a day (QID) | ORAL | Status: DC | PRN

## 2023-05-21 MED ORDER — METRONIDAZOLE 500 MG PO TABS
500.0000 mg | ORAL_TABLET | Freq: Two times a day (BID) | ORAL | Status: DC
  Administered 2023-05-21: 500 mg via ORAL
  Filled 2023-05-21: qty 1

## 2023-05-21 MED ORDER — AMLODIPINE BESYLATE 5 MG PO TABS: 5.0000 mg | ORAL_TABLET | Freq: Every day | ORAL | Status: DC

## 2023-05-21 MED ORDER — MORPHINE SULFATE (PF) 2 MG/ML IV SOLN: 2.0000 mg | INTRAVENOUS | Status: DC | PRN

## 2023-05-21 MED ORDER — SODIUM CHLORIDE 0.9 % IV SOLN: 250.0000 mL | INTRAVENOUS | Status: DC | PRN

## 2023-05-21 MED ORDER — LACTATED RINGERS IV SOLN: INTRAVENOUS | Status: DC

## 2023-05-21 MED ORDER — SODIUM ZIRCONIUM CYCLOSILICATE 10 G PO PACK
10.0000 g | PACK | Freq: Two times a day (BID) | ORAL | Status: AC
  Administered 2023-05-21 (×2): 10 g via ORAL
  Filled 2023-05-21 (×2): qty 1

## 2023-05-21 MED ORDER — ACETAMINOPHEN 650 MG RE SUPP
650.0000 mg | Freq: Four times a day (QID) | RECTAL | Status: DC | PRN
Start: 2023-05-21 — End: 2023-05-21

## 2023-05-21 MED ORDER — HYDRALAZINE HCL 20 MG/ML IJ SOLN: 5.0000 mg | Freq: Four times a day (QID) | INTRAMUSCULAR | Status: DC | PRN

## 2023-05-21 MED ORDER — SENNOSIDES-DOCUSATE SODIUM 8.6-50 MG PO TABS
1.0000 | ORAL_TABLET | Freq: Every evening | ORAL | Status: DC | PRN
Start: 2023-05-21 — End: 2023-05-23

## 2023-05-21 MED ORDER — SODIUM CHLORIDE 0.9 % IV SOLN
2.0000 g | INTRAVENOUS | Status: DC
  Administered 2023-05-21: 2 g via INTRAVENOUS
  Filled 2023-05-21: qty 20

## 2023-05-21 MED ORDER — ONDANSETRON HCL 4 MG/2ML IJ SOLN: 4.0000 mg | Freq: Four times a day (QID) | INTRAMUSCULAR | Status: DC | PRN

## 2023-05-21 NOTE — Progress Notes (Addendum)
PROGRESS NOTE                                                                                                                                                                                                             Patient Demographics:    Stephen Reid, is a 37 y.o. male, DOB - February 07, 1986, AVW:[id number]  Outpatient Primary MD for the patient is Lula Olszewski, MD    LOS - 0  Admit date - 05/20/2023    Chief Complaint  Patient presents with   Abdominal Pain   Jaundice       Brief Narrative (HPI from H&P)    37 y.o. male with medical history significant of Presented to emergency department at [facility] with complaining of evaluation for abdominal pain.  Patient has epigastric and right upper quadrant abdominal pain which has been bothering him for some time however it has been getting worse intermittently for last couple of weeks.   He does not abuse alcohol, does not take Tylenol, uses Celebrex as needed for back pain maximum 1 a day, has been recently taking hair growth supplement called Nutrafol for the last 2 months, in the ER workup suggestive of transaminitis, jaundice, CT suggestive of possible pancreatitis and colitis and he was admitted.   Subjective:    Stephen Reid today has, No headache, No chest pain, No abdominal pain - No Nausea, No new weakness tingling or numbness, no SOB   Assessment  & Plan :    Transaminitis, Jaundice, possible mild pancreatitis.  Terminal ileitis on CT.  Unclear etiology, CT scan does suggest that he has terminal ileitis and possible mild pancreatitis, he is currently pain-free, does have significant elevation in his LFTs along with total bilirubin, does take a hair growth supplement Nutrifol unclear side effect profile, ferritin somewhat elevated, otherwise anemia panel unremarkable.  No history of IBD.  GI on board defer further workup to GI.  Clinically much  improved.  Possible mild pancreatitis.  Clear liquid diet, gentle IV fluids, pain is resolved.  Monitor, GI on board.   Essential hypertension  - Continue hydralazine as needed.   Generalized anxiety disorder  -Continue Wellbutrin.   Hyperkalemia.  Lokelma and single dose Lasix.    Dyspepsia and epigastric pain  - PPI, checking stool H. pylori antigen.  Condition - Fair  Family Communication  : Family member bedside on 05/21/2023  Code Status :  Full  Consults  :  GI  PUD Prophylaxis : PPI   Procedures  :     CT -  1. Circumferential thickening and mild surrounding mesenteric inflammatory stranding involving the terminal ileum just proximal to the ileocecal junction in keeping with an infectious or inflammatory terminal ileitis. No evidence of obstruction or perforation. 2. Subtle peripancreatic inflammatory stranding surrounding the head of the pancreas extending into the adjacent mesenteric root with shotty peripancreatic and periportal adenopathy, which together may reflect changes of mild acute pancreatitis.      Disposition Plan  :    Status is: Observation   DVT Prophylaxis  :    heparin injection 5,000 Units Start: 05/21/23 0600 SCDs Start: 05/21/23 0141 Place TED hose Start: 05/21/23 0141    Lab Results  Component Value Date   PLT 275 05/21/2023    Diet :  Diet Order             Diet clear liquid Room service appropriate? Yes; Fluid consistency: Thin  Diet effective now                    Inpatient Medications  Scheduled Meds:  buPROPion ER  100 mg Oral Daily   furosemide  20 mg Oral Once   heparin  5,000 Units Subcutaneous Q8H   pantoprazole (PROTONIX) IV  40 mg Intravenous Q24H   sodium zirconium cyclosilicate  10 g Oral BID   Continuous Infusions:  cefTRIAXone (ROCEPHIN)  IV Stopped (05/21/23 0252)   lactated ringers Stopped (05/21/23 0325)   metronidazole 100 mL/hr at 05/21/23 0340   PRN Meds:.alum & mag hydroxide-simeth,  hydrALAZINE, ondansetron **OR** ondansetron (ZOFRAN) IV, senna-docusate, traMADol  Antibiotics  :    Anti-infectives (From admission, onward)    Start     Dose/Rate Route Frequency Ordered Stop   05/21/23 0230  cefTRIAXone (ROCEPHIN) 2 g in sodium chloride 0.9 % 100 mL IVPB        2 g 200 mL/hr over 30 Minutes Intravenous Every 24 hours 05/21/23 0143 05/28/23 0229   05/21/23 0230  metroNIDAZOLE (FLAGYL) IVPB 500 mg        500 mg 100 mL/hr over 60 Minutes Intravenous Every 12 hours 05/21/23 0143 05/24/23 1429         Objective:   Vitals:   05/21/23 0130 05/21/23 0133 05/21/23 0438 05/21/23 0751  BP:  123/87 121/71 113/87  Pulse:  65 63 70  Resp:  18 17   Temp:  97.8 F (36.6 C) 98.2 F (36.8 C) 98.1 F (36.7 C)  TempSrc:  Oral Oral Oral  SpO2:  96% 96% 99%  Weight: 89.4 kg     Height: 5\' 11"  (1.803 m)       Wt Readings from Last 3 Encounters:  05/21/23 89.4 kg  05/14/23 94.8 kg  08/28/22 104.1 kg     Intake/Output Summary (Last 24 hours) at 05/21/2023 0950 Last data filed at 05/21/2023 0340 Gross per 24 hour  Intake 1125.69 ml  Output --  Net 1125.69 ml     Physical Exam  Awake Alert, No new F.N deficits, Normal affect [place].AT,PERRAL, mild scleral icterus Supple Neck, No JVD,   Symmetrical Chest wall movement, Good air movement bilaterally, CTAB RRR,No Gallops,Rubs or new Murmurs,  +ve B.Sounds, Abd Soft, No tenderness,   No Cyanosis, Clubbing or edema       Data  Review:    Recent Labs  Lab 05/14/23 1207 05/20/23 1727 05/21/23 0818  WBC 8.5 10.7* 8.5  HGB 15.4 15.2 14.1  HCT 46.4 45.6 42.8  PLT 240.0  249 261 275  MCV 88.9 88.7 89.5  MCH  --  29.6 29.5  MCHC 33.1 33.3 32.9  RDW 13.3 13.5 13.4  LYMPHSABS 1.9  --  1.5  MONOABS 0.8  --  0.6  EOSABS 0.2  --  0.2  BASOSABS 0.0  --  0.0    Recent Labs  Lab 05/14/23 1207 05/14/23 1208 05/20/23 1727 05/21/23 0818  NA 135  --  134* 138  138  K 4.4  --  4.0 5.4*  5.6*  CL 96  --  97*  102  102  CO2 30  --  29 27  27   ANIONGAP  --   --  8 9  9   GLUCOSE 93  --  98 110*  109*  BUN 9  --  14 9  9   CREATININE 0.87  --  0.95 0.94  0.91  AST 234*  207*  --  538* 635*  677*  ALT 668*  609*  --  1,166* 1,220*  1,270*  ALKPHOS 220*  --  242* 213*  220*  BILITOT 4.4*  4.1*  --  5.4* 4.4*  4.3*  ALBUMIN 4.4  --  4.2 3.5  3.5  CRP  --   --   --  3.5*  INR  --   --   --  1.0  TSH  --  1.140  --   --   MG  --   --   --  2.0  CALCIUM 10.3  --  10.0 9.9  10.0      Recent Labs  Lab 05/14/23 1207 05/14/23 1208 05/20/23 1727 05/21/23 0818  CRP  --   --   --  3.5*  INR  --   --   --  1.0  TSH  --  1.140  --   --   MG  --   --   --  2.0  CALCIUM 10.3  --  10.0 9.9  10.0    --------------------------------------------------------------------------------------------------------------- Lab Results  Component Value Date   CHOL 164 05/14/2023   HDL 48 05/14/2023   LDLCALC 97 05/14/2023   TRIG 101 05/14/2023   CHOLHDL 3.4 05/14/2023    No results found for: "HGBA1C" No results for input(s): "TSH", "T4TOTAL", "FREET4", "T3FREE", "THYROIDAB" in the last 72 hours. Recent Labs    05/21/23 0818  FERRITIN 952*  TIBC 350  IRON 101   ------------------------------------------------------------------------------------------------------------------ Cardiac Enzymes No results for input(s): "CKMB", "TROPONINI", "MYOGLOBIN" in the last 168 hours.  Invalid input(s): "CK"  Micro Results No results found for this or any previous visit (from the past 240 hour(s)).  Radiology Reports CT ABDOMEN PELVIS W CONTRAST  Result Date: 05/20/2023 CLINICAL DATA:  Acute nonlocalized abdominal pain, right upper quadrant/epigastric abdominal pain EXAM: CT ABDOMEN AND PELVIS WITH CONTRAST TECHNIQUE: Multidetector CT imaging of the abdomen and pelvis was performed using the standard protocol following bolus administration of intravenous contrast. RADIATION DOSE REDUCTION: This  exam was performed according to the departmental dose-optimization program which includes automated exposure control, adjustment of the mA and/or kV according to patient size and/or use of iterative reconstruction technique. CONTRAST:  OMNIPAQUE IOHEXOL 300 MG/ML  SOLN COMPARISON:  08/08/2022 FINDINGS: Lower chest: No acute abnormality. Hepatobiliary: No focal liver abnormality is seen. No gallstones, gallbladder wall  thickening, or biliary dilatation. Pancreas: There is normal enhancement of the pancreatic parenchyma. The pancreatic duct is not dilated. There is, however, subtle peripancreatic inflammatory stranding surrounding the head of the pancreas extending into the adjacent mesenteric root, best seen on axial image # 23/2 and there is shotty peripancreatic and periportal adenopathy, best seen on coronal image # 58/5, which together may reflect changes of mild acute pancreatitis. No loculated peripancreatic fluid collections are identified. Spleen: Unremarkable Adrenals/Urinary Tract: Adrenal glands are unremarkable. Kidneys are normal, without renal calculi, focal lesion, or hydronephrosis. Bladder is unremarkable. Stomach/Bowel: There is circumferential thickening and mild surrounding mesenteric inflammatory stranding involving the terminal ileum just proximal to the ileocecal junction best seen on axial image # 58/2 and coronal image # 62/5 in keeping with an infectious or inflammatory terminal ileitis. There is no evidence of obstruction or perforation. No free intraperitoneal gas or fluid. The stomach, small bowel, and large bowel are otherwise unremarkable. Appendix normal. Vascular/Lymphatic: The abdominal vasculature is unremarkable. No frankly pathologic adenopathy within the abdomen and pelvis. Reproductive: Prostate is unremarkable. Other: No abdominal wall hernia Musculoskeletal: No acute bone abnormality. No lytic or blastic bone lesion. IMPRESSION: 1. Circumferential thickening and mild  surrounding mesenteric inflammatory stranding involving the terminal ileum just proximal to the ileocecal junction in keeping with an infectious or inflammatory terminal ileitis. No evidence of obstruction or perforation. 2. Subtle peripancreatic inflammatory stranding surrounding the head of the pancreas extending into the adjacent mesenteric root with shotty peripancreatic and periportal adenopathy, which together may reflect changes of mild acute pancreatitis. Correlation with serum lipase may be helpful for further evaluation. Electronically Signed   By: Helyn Numbers M.D.   On: 05/20/2023 19:59      Signature  -   Susa Raring M.D on 05/21/2023 at 9:50 AM   -  To page go to [email or web address]

## 2023-05-21 NOTE — Plan of Care (Signed)
  Problem: Education: Goal: Knowledge of General Education information will improve Description: Including pain rating scale, medication(s)/side effects and non-pharmacologic comfort measures 05/21/2023 0249 by Pamala Duffel, RN Outcome: Progressing 05/21/2023 0248 by Pamala Duffel, RN Outcome: Progressing 05/21/2023 0248 by Pamala Duffel, RN Outcome: Progressing   Problem: Health Behavior/Discharge Planning: Goal: Ability to manage health-related needs will improve 05/21/2023 0249 by Pamala Duffel, RN Outcome: Progressing 05/21/2023 0248 by Pamala Duffel, RN Outcome: Progressing 05/21/2023 0248 by Pamala Duffel, RN Outcome: Progressing   Problem: Clinical Measurements: Goal: Ability to maintain clinical measurements within normal limits will improve 05/21/2023 0249 by Pamala Duffel, RN Outcome: Progressing 05/21/2023 0248 by Pamala Duffel, RN Outcome: Progressing 05/21/2023 0248 by Pamala Duffel, RN Outcome: Progressing Goal: Will remain free from infection 05/21/2023 0249 by Pamala Duffel, RN Outcome: Progressing 05/21/2023 0248 by Pamala Duffel, RN Outcome: Progressing 05/21/2023 0248 by Pamala Duffel, RN Outcome: Progressing Goal: Diagnostic test results will improve 05/21/2023 0249 by Pamala Duffel, RN Outcome: Progressing 05/21/2023 0248 by Pamala Duffel, RN Outcome: Progressing 05/21/2023 0248 by Pamala Duffel, RN Outcome: Progressing Goal: Respiratory complications will improve 05/21/2023 0249 by Pamala Duffel, RN Outcome: Progressing 05/21/2023 0248 by Pamala Duffel, RN Outcome: Progressing 05/21/2023 0248 by Pamala Duffel, RN Outcome: Progressing Goal: Cardiovascular complication will be avoided 05/21/2023 0249 by Pamala Duffel, RN Outcome: Progressing 05/21/2023 0248 by Pamala Duffel, RN Outcome:  Progressing 05/21/2023 0248 by Pamala Duffel, RN Outcome: Progressing

## 2023-05-21 NOTE — H&P (Addendum)
History and Physical    Stephen Reid KGM:[id number] DOB: 1986-01-07 DOA: 05/20/2023  PCP: Lula Olszewski, MD   Patient coming from: Home   Chief Complaint:  Chief Complaint  Patient presents with   Abdominal Pain   Jaundice   ED TRIAGE note:  HPI:  Stephen Reid is a 37 y.o. male with medical history significant of  Presented to emergency department at [facility] with complaining of evaluation for abdominal pain.  Patient has epigastric and right upper quadrant abdominal pain which has been bothering him for some time however it has been getting worse intermittently for last couple of weeks.  Patient reported he is noticing more English discoloration of the skin than normal.  Reported not drinking alcohol since January 2024.  Also reported not taking Tylenol.  He has chronic back pain and usually takes Celebrex as needed.  Patient also has nausea.  Denies any vomiting, fever and chills.  Patient denies constipation, diarrhea, chest pain, shortness of breath, change of appetite and weight.   ED Course:  At presentation to ED patient found to have tachycardic 104, slight elevated blood pressure 152/104 otherwise hemodynamically stable. Lab work, showed elevated lipase 134. CMP showing low sodium 134, low chloride 97, elevated AST 538, elevated ALT 1066, elevated alkaline phosphatase 242 and elevated total bilirubin 5.4.  Per chart review transaminitis has getting worse since 05/14/2023. CBC showing slight leukocytosis 10.4. Acute hepatitis panel negative. Acetaminophen level less than 10.  CT abdomen pelvis: IMPRESSION: 1. Circumferential thickening and mild surrounding mesenteric inflammatory stranding involving the terminal ileum just proximal to the ileocecal junction in keeping with an infectious or inflammatory terminal ileitis. No evidence of obstruction or perforation. 2. Subtle peripancreatic inflammatory stranding surrounding the head of the pancreas extending  into the adjacent mesenteric root with shotty peripancreatic and periportal adenopathy, which together may reflect changes of mild acute pancreatitis. Correlation with serum lipase may be helpful for further evaluation.  ED physician Dr.Schuman spoke with on-call GI Dr. Marina Goodell  who states that patient can be admitted to medicine that they will see him in the morning as patient does not have dilated pancreatic duct and does not need MRCP at this time.  Patient has been transferred from [facility] to [facility] for further evaluation management of worsening transaminitis and hyperbilirubinemia.  Review of Systems:  Review of Systems  Constitutional:  Negative for chills, fever, malaise/fatigue and weight loss.  Respiratory:  Negative for cough and shortness of breath.   Cardiovascular:  Negative for chest pain and palpitations.  Gastrointestinal:  Positive for abdominal pain and nausea. Negative for constipation, diarrhea, heartburn and vomiting.  Genitourinary:  Negative for dysuria and urgency.  Musculoskeletal:  Positive for back pain.  Skin:  Negative for itching and rash.  Neurological:  Negative for dizziness and headaches.  Psychiatric/Behavioral:  The patient is not nervous/anxious.     Past Medical History:  Diagnosis Date   Abdominal pain with vomiting 08/09/2022   Alcohol dependence (HCC)    Cellulitis of left ankle 03/15/2020   GERD (gastroesophageal reflux disease) 08/28/2022   History of gastritis hospitalization   Hypertension    Marijuana abuse 08/09/2022   Obese 08/28/2022   Pain in left foot 03/05/2020    History reviewed. No pertinent surgical history.   reports that he has been smoking cigarettes. He has a 46 pack-year smoking history. He does not have any smokeless tobacco history on file. He reports that he does not currently use alcohol. He reports  that he does not currently use drugs after having used the following drugs: Marijuana.  No Known  Allergies  Family History  Problem Relation Age of Onset   Alcohol abuse Mother    Early death Father    Diabetes Father    Depression Father    Alcohol abuse Father    Heart attack Father    Alcohol abuse Paternal Uncle    Diabetes Maternal Grandmother    Diabetes Maternal Grandfather    Stroke Paternal Grandmother    Diabetes Paternal Grandmother    Arthritis Paternal Grandmother    Asthma Paternal Grandmother    Stroke Paternal Grandfather    Heart attack Paternal Grandfather    Arthritis Paternal Grandfather    Diabetes Paternal Grandfather     Prior to Admission medications   Medication Sig Start Date End Date Taking? Authorizing Provider  amLODipine (NORVASC) 5 MG tablet Take 1 tablet (5 mg total) by mouth daily. Ok to stop if blood pressure less than 140/90 08/28/22 11/26/22  Lula Olszewski, MD  buPROPion ER [facility] SR) 100 MG 12 hr tablet Take 1 tablet (100 mg total) by mouth daily. Start at full tablet daily due to it cannot be split in half 05/14/23   Lula Olszewski, MD  celecoxib (CELEBREX) 100 MG capsule Take 1 capsule (100 mg total) by mouth 2 (two) times daily. 05/14/23   Lula Olszewski, MD  ondansetron (ZOFRAN-ODT) 4 MG disintegrating tablet Take 1 tablet (4 mg total) by mouth every 4 (four) hours as needed for nausea or vomiting. 05/14/23   Lula Olszewski, MD     Physical Exam: Vitals:   05/20/23 2123 05/20/23 2140 05/21/23 0130 05/21/23 0133  BP:  123/89  123/87  Pulse:  71  65  Resp:  18  18  Temp: 98.6 F (37 C)   97.8 F (36.6 C)  TempSrc: Oral   Oral  SpO2:  98%  96%  Weight:   89.4 kg   Height:   5\' 11"  (1.803 m)     Physical Exam Constitutional:      General: He is not in acute distress.    Appearance: He is not ill-appearing.  Cardiovascular:     Rate and Rhythm: Normal rate and regular rhythm.     Heart sounds: Normal heart sounds.  Pulmonary:     Effort: Pulmonary effort is normal.     Breath sounds: Normal breath sounds.   Abdominal:     General: Bowel sounds are normal.     Palpations: Abdomen is soft. There is no hepatomegaly, splenomegaly or mass.     Tenderness: There is abdominal tenderness in the epigastric area. There is no guarding or rebound. Negative signs include Murphy's sign, Rovsing's sign and McBurney's sign.     Hernia: There is no hernia in the umbilical area or ventral area.  Neurological:     Mental Status: He is oriented to person, place, and time.  Psychiatric:        Mood and Affect: Mood normal. Mood is not anxious.        Behavior: Behavior normal.      Labs on Admission: I have personally reviewed following labs and imaging studies  CBC: Recent Labs  Lab 05/14/23 1207 05/20/23 1727  WBC 8.5 10.7*  NEUTROABS 5.6  --   HGB 15.4 15.2  HCT 46.4 45.6  MCV 88.9 88.7  PLT 240.0  249 261   Basic Metabolic Panel: Recent Labs  Lab 05/14/23  1207 05/20/23 1727  NA 135 134*  K 4.4 4.0  CL 96 97*  CO2 30 29  GLUCOSE 93 98  BUN 9 14  CREATININE 0.87 0.95  CALCIUM 10.3 10.0   GFR: Estimated Creatinine Clearance: 111.2 mL/min (by C-G formula based on SCr of 0.95 mg/dL). Liver Function Tests: Recent Labs  Lab 05/14/23 1207 05/20/23 1727  AST 234*  207* 538*  ALT 668*  609* 1,166*  ALKPHOS 220* 242*  BILITOT 4.4*  4.1* 5.4*  PROT 7.6 8.2*  ALBUMIN 4.4 4.2   Recent Labs  Lab 05/20/23 1727  LIPASE 134*   No results for input(s): "AMMONIA" in the last 168 hours. Coagulation Profile: No results for input(s): "INR", "PROTIME" in the last 168 hours. Cardiac Enzymes: No results for input(s): "CKTOTAL", "CKMB", "CKMBINDEX", "TROPONINI", "TROPONINIHS" in the last 168 hours. BNP (last 3 results) No results for input(s): "BNP" in the last 8760 hours. HbA1C: No results for input(s): "HGBA1C" in the last 72 hours. CBG: No results for input(s): "GLUCAP" in the last 168 hours. Lipid Profile: No results for input(s): "CHOL", "HDL", "LDLCALC", "TRIG", "CHOLHDL",  "LDLDIRECT" in the last 72 hours. Thyroid Function Tests: No results for input(s): "TSH", "T4TOTAL", "FREET4", "T3FREE", "THYROIDAB" in the last 72 hours. Anemia Panel: No results for input(s): "VITAMINB12", "FOLATE", "FERRITIN", "TIBC", "IRON", "RETICCTPCT" in the last 72 hours. Urine analysis:    Component Value Date/Time   COLORURINE YELLOW 05/20/2023 1836   APPEARANCEUR CLEAR 05/20/2023 1836   LABSPEC 1.008 05/20/2023 1836   PHURINE 6.0 05/20/2023 1836   GLUCOSEU NEGATIVE 05/20/2023 1836   GLUCOSEU NEGATIVE 05/14/2023 1207   HGBUR NEGATIVE 05/20/2023 1836   BILIRUBINUR NEGATIVE 05/20/2023 1836   BILIRUBINUR Negative 05/14/2023 1214   KETONESUR NEGATIVE 05/20/2023 1836   PROTEINUR NEGATIVE 05/20/2023 1836   UROBILINOGEN 0.2 05/14/2023 1214   UROBILINOGEN 0.2 05/14/2023 1207   NITRITE NEGATIVE 05/20/2023 1836   LEUKOCYTESUR NEGATIVE 05/20/2023 1836    Radiological Exams on Admission: I have personally reviewed images CT ABDOMEN PELVIS W CONTRAST  Result Date: 05/20/2023 CLINICAL DATA:  Acute nonlocalized abdominal pain, right upper quadrant/epigastric abdominal pain EXAM: CT ABDOMEN AND PELVIS WITH CONTRAST TECHNIQUE: Multidetector CT imaging of the abdomen and pelvis was performed using the standard protocol following bolus administration of intravenous contrast. RADIATION DOSE REDUCTION: This exam was performed according to the departmental dose-optimization program which includes automated exposure control, adjustment of the mA and/or kV according to patient size and/or use of iterative reconstruction technique. CONTRAST:  OMNIPAQUE IOHEXOL 300 MG/ML  SOLN COMPARISON:  08/08/2022 FINDINGS: Lower chest: No acute abnormality. Hepatobiliary: No focal liver abnormality is seen. No gallstones, gallbladder wall thickening, or biliary dilatation. Pancreas: There is normal enhancement of the pancreatic parenchyma. The pancreatic duct is not dilated. There is, however, subtle  peripancreatic inflammatory stranding surrounding the head of the pancreas extending into the adjacent mesenteric root, best seen on axial image # 23/2 and there is shotty peripancreatic and periportal adenopathy, best seen on coronal image # 58/5, which together may reflect changes of mild acute pancreatitis. No loculated peripancreatic fluid collections are identified. Spleen: Unremarkable Adrenals/Urinary Tract: Adrenal glands are unremarkable. Kidneys are normal, without renal calculi, focal lesion, or hydronephrosis. Bladder is unremarkable. Stomach/Bowel: There is circumferential thickening and mild surrounding mesenteric inflammatory stranding involving the terminal ileum just proximal to the ileocecal junction best seen on axial image # 58/2 and coronal image # 62/5 in keeping with an infectious or inflammatory terminal ileitis. There is no evidence of  obstruction or perforation. No free intraperitoneal gas or fluid. The stomach, small bowel, and large bowel are otherwise unremarkable. Appendix normal. Vascular/Lymphatic: The abdominal vasculature is unremarkable. No frankly pathologic adenopathy within the abdomen and pelvis. Reproductive: Prostate is unremarkable. Other: No abdominal wall hernia Musculoskeletal: No acute bone abnormality. No lytic or blastic bone lesion. IMPRESSION: 1. Circumferential thickening and mild surrounding mesenteric inflammatory stranding involving the terminal ileum just proximal to the ileocecal junction in keeping with an infectious or inflammatory terminal ileitis. No evidence of obstruction or perforation. 2. Subtle peripancreatic inflammatory stranding surrounding the head of the pancreas extending into the adjacent mesenteric root with shotty peripancreatic and periportal adenopathy, which together may reflect changes of mild acute pancreatitis. Correlation with serum lipase may be helpful for further evaluation. Electronically Signed   By: Helyn Numbers M.D.   On:  05/20/2023 19:59     Assessment/Plan: Principal Problem:   Jaundice Active Problems:   Terminal ileitis (HCC)   Acute pancreatitis   Essential hypertension   Transaminitis   GAD (generalized anxiety disorder)   Dyspepsia    Assessment and Plan: Transaminitis Jaundice -Patient presenting with epigastric and right upper quadrant abdominal pain which has been progressively getting worse.  He has nausea.  Denies any vomiting and diarrhea. -Presentation to ED patient is hemodynamically stable. -CMP showing elevated AST 538, elevated ALT 1166, elevated alkaline phosphatase 242, bilirubin 5.4.  Elevated lipase 134. - CT abdomen pelvis evidence of acute pancreatitis.  And terminal ileitis. - ED physician spoke with on-call GI Dr. Marina Goodell, per GI given patient does not have any pancreatic duct dilation do not need any MRCP at this time.  Will see patient in the morning. -Patient reported history of drinking alcohol since January 2024.  Also does not take any Tylenol about the same time. -Lab work, hepatitis panel negative.  Normal acetaminophen level. - Other differential of transaminitis include viral etiology, autoimmune hepatic cirrhosis, nonalcoholic fatty liver, celiac disease, hemochromatosis, alpha-1 antitrypsin deficiency, Wilson disease. -Checking CMV, EBV, VZV, HSV, alpha-1 antitrypsin level, anti-smooth muscle antibody, iron/TIBC level, serum ceruloplasmin and urine copper level. -Will follow-up with GI for further recommendation. -Continue clear liquid diet. -Avoid hepatotoxic agents.   Terminal ileitis -CT abdomen pelvis showing evidence of terminal ileitis.  No evidence of perforation and obstruction. - Starting treating with IV ceftriaxone 2 g daily and metronidazole 500 mg IV twice daily. - Checking blood cultures. - Continue to monitor WBC and fever curve.  Acute pancreatitis -Midepigastric pain, elevated lipase and CT abdomen pelvis showing evidence of acute mild  pancreatitis.  No evidence of biliary duct dilation.  No evidence of gallstone. -Continue clear liquid diet. - Starting maintenance fluid LR 125 cc/h for 1 day. - Continue morphine as needed  Essential hypertension -Patient reported history of hypertension.  Currently not taking it anymore. - Continue hydralazine as needed.  Generalized anxiety disorder -Continue Wellbutrin.  Dyspepsia and epigastric pain - Patient is complaining about heartburn and nausea. - Starting Protonix 40 mg IV daily -Checking stool H. pylori antigen.   DVT prophylaxis:  SQ Heparin Code Status:  Full Code Diet: Clear liquid diet Family Communication: No family member at bedside now Disposition Plan: Waiting for gastroenterology evaluation. Consults: Gastroenterology Admission status:   Observation, Med-Surg  Severity of Illness: The appropriate patient status for this patient is OBSERVATION. Observation status is judged to be reasonable and necessary in order to provide the required intensity of service to ensure the patient's safety. The patient's presenting symptoms, physical  exam findings, and initial radiographic and laboratory data in the context of their medical condition is felt to place them at decreased risk for further clinical deterioration. Furthermore, it is anticipated that the patient will be medically stable for discharge from the hospital within 2 midnights of admission.    Tereasa Coop, MD [facility]  How to contact the [facility] Attending or Consulting provider 7A - 7P or covering provider during after hours 7P -7A, for this patient.  Check the care team in [facility] and look for a) attending/consulting TR[facility] provider listed and b) the [facility] team listed Log into [email or web address] and use [facility]'s universal password to access. If you do not have the password, please contact the hospital operator. Locate the [facility] provider you are looking for under [facility] and page to a number that  you can be directly reached. If you still have difficulty reaching the provider, please page the [facility] (Director on Call) for the Hospitalists listed on amion for assistance.  05/21/2023, 3:52 AM

## 2023-05-21 NOTE — Plan of Care (Signed)
  Problem: Education: Goal: Knowledge of General Education information will improve Description: Including pain rating scale, medication(s)/side effects and non-pharmacologic comfort measures 05/21/2023 0248 by Pamala Duffel, RN Outcome: Progressing 05/21/2023 0248 by Pamala Duffel, RN Outcome: Progressing   Problem: Education: Goal: Knowledge of General Education information will improve Description: Including pain rating scale, medication(s)/side effects and non-pharmacologic comfort measures 05/21/2023 0248 by Pamala Duffel, RN Outcome: Progressing 05/21/2023 0248 by Pamala Duffel, RN Outcome: Progressing   Problem: Health Behavior/Discharge Planning: Goal: Ability to manage health-related needs will improve 05/21/2023 0248 by Pamala Duffel, RN Outcome: Progressing 05/21/2023 0248 by Pamala Duffel, RN Outcome: Progressing   Problem: Clinical Measurements: Goal: Ability to maintain clinical measurements within normal limits will improve 05/21/2023 0248 by Pamala Duffel, RN Outcome: Progressing 05/21/2023 0248 by Pamala Duffel, RN Outcome: Progressing Goal: Will remain free from infection 05/21/2023 0248 by Pamala Duffel, RN Outcome: Progressing 05/21/2023 0248 by Pamala Duffel, RN Outcome: Progressing Goal: Diagnostic test results will improve 05/21/2023 0248 by Pamala Duffel, RN Outcome: Progressing 05/21/2023 0248 by Pamala Duffel, RN Outcome: Progressing Goal: Respiratory complications will improve 05/21/2023 0248 by Pamala Duffel, RN Outcome: Progressing 05/21/2023 0248 by Pamala Duffel, RN Outcome: Progressing Goal: Cardiovascular complication will be avoided 05/21/2023 0248 by Pamala Duffel, RN Outcome: Progressing 05/21/2023 0248 by Pamala Duffel, RN Outcome: Progressing

## 2023-05-21 NOTE — Plan of Care (Signed)
  Problem: Education: Goal: Knowledge of General Education information will improve Description Including pain rating scale, medication(s)/side effects and non-pharmacologic comfort measures Outcome: Progressing   

## 2023-05-21 NOTE — Plan of Care (Signed)

## 2023-05-21 NOTE — Consult Note (Addendum)
Consultation  Referring Provider: Dr. Thedore Mins     Primary Care Physician:  Lula Olszewski, MD Primary Gastroenterologist: Anthonette Legato with Dr. Myrtie Neither in January Reason for Consultation:   Jaundice, pancreatitis         HPI:   Stephen Reid is a 37 y.o. male with a past medical history as listed below including alcohol dependence and reflux, who presented to the ER on 05/20/2023 with abdominal pain and jaundice.    05/14/2023 AST 234, ALT 668, alk phos 220, direct bilirubin 3.0, indirect 1.4, total bilirubin 4.4.    At time of presentation patient was complaining of abdominal pain in the epigastrium and right upper quadrant getting worse over the past few weeks.  Was also noticing some yellow discoloration to his skin.  Apparently has not drank alcohol since January 2024.  Chronic back pain on Celebrex.    Today, patient seen with his family member by his bedside who does assist with history.  He describes that back in January he was seen in the ER for a gastroenteritis thought related to alcohol and NSAID use.  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  He and his significant other have maintained a healthy lifestyle since then starting to exercise daily and switched over to a Mediterranean diet.      In general he had been feeling really well until about 2 weeks ago when he and his significant other came down with what they thought was the flu with fever, chills and body aches.  When his significant other started to get better he started to feel worse with increasing epigastric/right upper quadrant pain.  Went to see his PCP on the 18th who told him that he was in "liver failure".  At that point he started calling everyone he could think of and in fact called Dawn Dracyk at the [facility] and was supposed to have an appointment today at  2:00.  She called him to discuss things further and recommend that he go to the ER as the picture just did not quite make sense.    Currently patient was unaware he was on a clear liquid diet and ate a biscuit this morning.  No further abdominal pain, nausea, vomiting or anything else.  Tells me he has a bowel movement when he eats but he has not been eating much over the past week or so as that seemed to previously to exacerbate his epigastric discomfort.  He is wondering what could have caused all this.  Does describe also being on a hair growth supplement Nutrofoil over the past 2 months.    No family history of liver disease.    Denies weight loss or symptoms that awaken him from sleep.  ED course: Tachycardic 104, BP 152/104, elevated lipase at 134, CMP with a sodium low at 134, low chloride 97, elevated AST 538, elevated ALT 1066, elevated alk phos 242 and total bili 5.4 (worse since 05/14/2023), slight leukocytosis 10.4, acute hepatitis panel negative and acetaminophen level less than 10; CTAP with circumferential thickening and mild surrounding mesenteric inflammatory stranding involving the terminal ileum just proximal to the ileocecal junction in keeping with infectious or inflammatory terminal ileitis, subtle peripancreatic inflammatory stranding surrounding the head of the pancreas extending into the adjacent mesenteric root which shotty.  Pancreatic and periportal adenopathy  which may reflect mild acute pancreatitis  GI history: None  Past Medical History:  Diagnosis Date   Abdominal pain with vomiting 08/09/2022   Alcohol dependence (HCC)    Cellulitis of left ankle 03/15/2020   GERD (gastroesophageal reflux disease) 08/28/2022   History of gastritis hospitalization   Hypertension    Marijuana abuse 08/09/2022   Obese 08/28/2022   Pain in left foot 03/05/2020    History reviewed. No pertinent surgical history.  Family History  Problem Relation Age of Onset   Alcohol abuse  Mother    Early death Father    Diabetes Father    Depression Father    Alcohol abuse Father    Heart attack Father    Alcohol abuse Paternal Uncle    Diabetes Maternal Grandmother    Diabetes Maternal Grandfather    Stroke Paternal Grandmother    Diabetes Paternal Grandmother    Arthritis Paternal Grandmother    Asthma Paternal Grandmother    Stroke Paternal Grandfather    Heart attack Paternal Grandfather    Arthritis Paternal Grandfather    Diabetes Paternal Grandfather     Social History   Tobacco Use   Smoking status: Every Day    Current packs/day: 2.00    Average packs/day: 2.0 packs/day for 23.0 years (46.0 ttl pk-yrs)    Types: Cigarettes  Vaping Use   Vaping status: Former  Substance Use Topics   Alcohol use: Not Currently    Comment: Multiple Cocktails Daily   Drug use: Not Currently    Types: Marijuana    Comment: Occ    Prior to Admission medications   Medication Sig Start Date End Date Taking? Authorizing Provider  buPROPion ER (WELLBUTRIN SR) 100 MG 12 hr tablet Take 1 tablet (100 mg total) by mouth daily. Start at full tablet daily due to it cannot be split in half 05/14/23  Yes Lula Olszewski, MD  celecoxib (CELEBREX) 100 MG capsule Take 1 capsule (100 mg total) by mouth 2 (two) times daily. 05/14/23  Yes Lula Olszewski, MD  ondansetron (ZOFRAN-ODT) 4 MG disintegrating tablet Take 1 tablet (4 mg total) by mouth every 4 (four) hours as needed for nausea or vomiting. 05/14/23  Yes Lula Olszewski, MD  OVER THE COUNTER MEDICATION Take 4 capsules by mouth daily at 6 (six) AM. Nutrafol - hair growth supplement   Yes [provider]  amLODipine (NORVASC) 5 MG tablet Take 1 tablet (5 mg total) by mouth daily. Ok to stop if blood pressure less than 140/90 Patient not taking: Reported on 05/21/2023 08/28/22 11/26/22  Lula Olszewski, MD    Current Facility-Administered Medications  Medication Dose Route Frequency Provider Last Rate Last Admin   alum  & mag hydroxide-simeth (MAALOX/MYLANTA) 200-200-20 MG/5ML suspension 15 mL  15 mL Oral Q6H PRN Janalyn Shy, Subrina, MD       buPROPion ER [facility] SR) 12 hr tablet 100 mg  100 mg Oral Daily Sundil, Subrina, MD   100 mg at 05/21/23 2725   cefTRIAXone (ROCEPHIN) 2 g in sodium chloride 0.9 % 100 mL IVPB  2 g Intravenous Q24H Janalyn Shy, Subrina, MD   Stopped at 05/21/23 0252   furosemide (LASIX) tablet 20 mg  20 mg Oral Once Leroy Sea, MD       heparin injection 5,000 Units  5,000 Units Subcutaneous Q8H Sundil, Subrina, MD       hydrALAZINE (APRESOLINE) injection 5 mg  5 mg Intravenous Q6H PRN Tereasa Coop, MD  lactated ringers infusion   Intravenous Continuous Leroy Sea, MD   Stopped at 05/21/23 0325   metroNIDAZOLE (FLAGYL) IVPB 500 mg  500 mg Intravenous Q12H Sundil, Subrina, MD 100 mL/hr at 05/21/23 0340 Infusion Verify at 05/21/23 0340   ondansetron (ZOFRAN) tablet 4 mg  4 mg Oral Q6H PRN Tereasa Coop, MD       Or   ondansetron [facility]) injection 4 mg  4 mg Intravenous Q6H PRN Janalyn Shy, Subrina, MD       pantoprazole (PROTONIX) injection 40 mg  40 mg Intravenous Q24H Sundil, Subrina, MD   40 mg at 05/21/23 5784   senna-docusate (Senokot-S) tablet 1 tablet  1 tablet Oral QHS PRN Janalyn Shy, Subrina, MD       sodium zirconium cyclosilicate (LOKELMA) packet 10 g  10 g Oral BID Leroy Sea, MD       traMADol Janean Sark) tablet 50 mg  50 mg Oral Q6H PRN Leroy Sea, MD        Allergies as of 05/20/2023   (No Known Allergies)     Review of Systems:    Constitutional: No weight loss Skin: No rash or itching Cardiovascular: No chest pain Respiratory: No SOB  Gastrointestinal: See HPI and otherwise negative Genitourinary: No dysuria Neurological: No headache, dizziness or syncope Musculoskeletal: No new muscle or joint pain Hematologic: No bleeding Psychiatric: No history of depression or anxiety    Physical Exam:  Vital signs in last 24 hours: Temp:  [97.8 F  (36.6 C)-98.6 F (37 C)] 98.1 F (36.7 C) (10/25 0751) Pulse Rate:  [63-104] 70 (10/25 0751) Resp:  [17-18] 17 (10/25 0438) BP: (113-152)/(71-104) 113/87 (10/25 0751) SpO2:  [96 %-99 %] 99 % (10/25 0751) Weight:  [88 kg-89.4 kg] 89.4 kg (10/25 0130) Last BM Date : 05/19/23 General:   Pleasant jaundiced Caucasian male appears to be in NAD, Well developed, Well nourished, alert and cooperative Head:  Normocephalic and atraumatic. Eyes:   PEERL, EOMI. No icterus. Conjunctiva pink. Ears:  Normal auditory acuity. Neck:  Supple Throat: Oral cavity and pharynx without inflammation, swelling or lesion. Teeth in good condition. Lungs: Respirations even and unlabored. Lungs clear to auscultation bilaterally.   No wheezes, crackles, or rhonchi.  Heart: Normal S1, S2. No MRG. Regular rate and rhythm. No peripheral edema, cyanosis or pallor.  Abdomen:  Soft, nondistended, nontender. No rebound or guarding. Normal bowel sounds. No appreciable masses or hepatomegaly. Rectal:  Not performed.  Msk:  Symmetrical without gross deformities. Peripheral pulses intact.  Extremities:  Without edema, no deformity or joint abnormality. Normal ROM, normal sensation. Neurologic:  Alert and  oriented x4;  grossly normal neurologically.  Skin:   Dry and intact without significant lesions or rashes. Psychiatric: Demonstrates good judgement and reason without abnormal affect or behaviors.   LAB RESULTS: Recent Labs    05/20/23 1727 05/21/23 0818  WBC 10.7* 8.5  HGB 15.2 14.1  HCT 45.6 42.8  PLT 261 275   BMET Recent Labs    05/20/23 1727 05/21/23 0818  NA 134* 138  138  K 4.0 5.4*  5.6*  CL 97* 102  102  CO2 29 27  27   GLUCOSE 98 110*  109*  BUN 14 9  9   CREATININE 0.95 0.94  0.91  CALCIUM 10.0 9.9  10.0      Latest Ref Rng & Units 05/21/2023    8:18 AM 05/20/2023    5:27 PM 05/14/2023   12:07 PM  Hepatic Function  Total  Protein 6.5 - 8.1 g/dL 6.5 - 8.1 g/dL 7.4    6.8  8.2  7.6    Albumin 3.5 - 5.0 g/dL 3.5 - 5.0 g/dL 3.5    3.5  4.2  4.4   AST 15 - 41 U/L 15 - 41 U/L 635    677  538  234    207   ALT 0 - 44 U/L 0 - 44 U/L 1,220    1,270  1,166  668    609   Alk Phosphatase 38 - 126 U/L 38 - 126 U/L 213    220  242  220   Total Bilirubin 0.3 - 1.2 mg/dL 0.3 - 1.2 mg/dL 4.4    4.3  5.4  4.1    4.4   Bilirubin, Direct 0.0 - 0.2 mg/dL   3.0      PT/INR Recent Labs    05/21/23 0818  LABPROT 12.9  INR 1.0   Lipase     Component Value Date/Time   LIPASE 103 (H) 05/21/2023 0818    STUDIES: CT ABDOMEN PELVIS W CONTRAST  Result Date: 05/20/2023 CLINICAL DATA:  Acute nonlocalized abdominal pain, right upper quadrant/epigastric abdominal pain EXAM: CT ABDOMEN AND PELVIS WITH CONTRAST TECHNIQUE: Multidetector CT imaging of the abdomen and pelvis was performed using the standard protocol following bolus administration of intravenous contrast. RADIATION DOSE REDUCTION: This exam was performed according to the departmental dose-optimization program which includes automated exposure control, adjustment of the mA and/or kV according to patient size and/or use of iterative reconstruction technique. CONTRAST:  OMNIPAQUE IOHEXOL 300 MG/ML  SOLN COMPARISON:  08/08/2022 FINDINGS: Lower chest: No acute abnormality. Hepatobiliary: No focal liver abnormality is seen. No gallstones, gallbladder wall thickening, or biliary dilatation. Pancreas: There is normal enhancement of the pancreatic parenchyma. The pancreatic duct is not dilated. There is, however, subtle peripancreatic inflammatory stranding surrounding the head of the pancreas extending into the adjacent mesenteric root, best seen on axial image # 23/2 and there is shotty peripancreatic and periportal adenopathy, best seen on coronal image # 58/5, which together may reflect changes of mild acute pancreatitis. No loculated peripancreatic fluid collections are identified. Spleen: Unremarkable Adrenals/Urinary Tract:  Adrenal glands are unremarkable. Kidneys are normal, without renal calculi, focal lesion, or hydronephrosis. Bladder is unremarkable. Stomach/Bowel: There is circumferential thickening and mild surrounding mesenteric inflammatory stranding involving the terminal ileum just proximal to the ileocecal junction best seen on axial image # 58/2 and coronal image # 62/5 in keeping with an infectious or inflammatory terminal ileitis. There is no evidence of obstruction or perforation. No free intraperitoneal gas or fluid. The stomach, small bowel, and large bowel are otherwise unremarkable. Appendix normal. Vascular/Lymphatic: The abdominal vasculature is unremarkable. No frankly pathologic adenopathy within the abdomen and pelvis. Reproductive: Prostate is unremarkable. Other: No abdominal wall hernia Musculoskeletal: No acute bone abnormality. No lytic or blastic bone lesion. IMPRESSION: 1. Circumferential thickening and mild surrounding mesenteric inflammatory stranding involving the terminal ileum just proximal to the ileocecal junction in keeping with an infectious or inflammatory terminal ileitis. No evidence of obstruction or perforation. 2. Subtle peripancreatic inflammatory stranding surrounding the head of the pancreas extending into the adjacent mesenteric root with shotty peripancreatic and periportal adenopathy, which together may reflect changes of mild acute pancreatitis. Correlation with serum lipase may be helpful for further evaluation. Electronically Signed   By: Helyn Numbers M.D.   On: 05/20/2023 19:59     Impression / Plan:   Impression: 1.  Elevated LFTs with jaundice: See LFTs above, bili is trending down, others are trending up overnight, all increased since 05/14/2023, normal back in January, CT with question of pancreatitis, patient is on Celebrex over the past 10 months, PT/INR normal;consider autoimmune versus DILI versus viral versus other 2.  Terminal ileitis: Seen on CT of the abdomen  pelvis, started on IV ceftriaxone and Metronidazole at admission-no reports of diarrhea prior 3.  Acute pancreatitis: Seen on CT with mild elevation in lipase 4.  Generalized anxiety disorder  Plan: 1.  Additional labs have already been ordered by the hospitalist team.  Will ensure that we do not need to add any others. 2.  Continue fluids for now 3.  Patient's diet can likely be used as he tolerated a biscuit this morning.  Will move him to a low-fat diet and see how he does with this. 4.  Would hold Celebrex and hair supplement for now. 5.  Per Dr. Adela Lank added IGG, ANA, Hep C RNA and GI path panel  Thank you for your kind consultation, we will continue to follow.  Violet Baldy Zayleigh Stroh  05/21/2023, 10:10 AM

## 2023-05-22 DIAGNOSIS — K5000 Crohn's disease of small intestine without complications: Secondary | ICD-10-CM | POA: Diagnosis not present

## 2023-05-22 DIAGNOSIS — R17 Unspecified jaundice: Secondary | ICD-10-CM | POA: Diagnosis not present

## 2023-05-22 DIAGNOSIS — R7401 Elevation of levels of liver transaminase levels: Secondary | ICD-10-CM | POA: Diagnosis not present

## 2023-05-22 LAB — PROTIME-INR
INR: 1 (ref 0.8–1.2)
Prothrombin Time: 12.9 s (ref 11.4–15.2)

## 2023-05-22 LAB — CBC WITH DIFFERENTIAL/PLATELET
Abs Immature Granulocytes: 0.03 10*3/uL (ref 0.00–0.07)
Basophils Absolute: 0 10*3/uL (ref 0.0–0.1)
Basophils Relative: 0 %
Eosinophils Absolute: 0.3 10*3/uL (ref 0.0–0.5)
Eosinophils Relative: 3 %
HCT: 39.7 % (ref 39.0–52.0)
Hemoglobin: 13.4 g/dL (ref 13.0–17.0)
Immature Granulocytes: 0 %
Lymphocytes Relative: 22 %
Lymphs Abs: 1.7 10*3/uL (ref 0.7–4.0)
MCH: 29.4 pg (ref 26.0–34.0)
MCHC: 33.8 g/dL (ref 30.0–36.0)
MCV: 87.1 fL (ref 80.0–100.0)
Monocytes Absolute: 0.7 10*3/uL (ref 0.1–1.0)
Monocytes Relative: 10 %
Neutro Abs: 4.9 10*3/uL (ref 1.7–7.7)
Neutrophils Relative %: 65 %
Platelets: 240 10*3/uL (ref 150–400)
RBC: 4.56 MIL/uL (ref 4.22–5.81)
RDW: 13.3 % (ref 11.5–15.5)
WBC: 7.6 10*3/uL (ref 4.0–10.5)
nRBC: 0 % (ref 0.0–0.2)

## 2023-05-22 LAB — GASTROINTESTINAL PANEL BY PCR, STOOL (REPLACES STOOL CULTURE)

## 2023-05-22 LAB — COMPREHENSIVE METABOLIC PANEL
ALT: 1142 U/L — ABNORMAL HIGH (ref 0–44)
AST: 579 U/L — ABNORMAL HIGH (ref 15–41)
Albumin: 3 g/dL — ABNORMAL LOW (ref 3.5–5.0)
Alkaline Phosphatase: 192 U/L — ABNORMAL HIGH (ref 38–126)
Anion gap: 9 (ref 5–15)
BUN: 9 mg/dL (ref 6–20)
CO2: 28 mmol/L (ref 22–32)
Calcium: 9.4 mg/dL (ref 8.9–10.3)
Chloride: 100 mmol/L (ref 98–111)
Creatinine, Ser: 0.91 mg/dL (ref 0.61–1.24)
GFR, Estimated: >60 mL/min (ref >60)
Glucose, Bld: 105 mg/dL — ABNORMAL HIGH (ref 70–99)
Potassium: 3.8 mmol/L (ref 3.5–5.1)
Sodium: 137 mmol/L (ref 135–145)
Total Bilirubin: 3.4 mg/dL — ABNORMAL HIGH (ref 0.3–1.2)
Total Protein: 6.4 g/dL — ABNORMAL LOW (ref 6.5–8.1)

## 2023-05-22 LAB — C-REACTIVE PROTEIN: CRP: 2.4 mg/dL — ABNORMAL HIGH (ref <1.0)

## 2023-05-22 LAB — MAGNESIUM: Magnesium: 1.9 mg/dL (ref 1.7–2.4)

## 2023-05-22 NOTE — Progress Notes (Signed)
Progress Note   Subjective  Chief Complaint: Jaundice, pancreatitis and elevated LFTs  Thankfully LFTs are trending down today.  Patient is up and walking about the room when I come in has no new complaints or concerns.   Objective   Vital signs in last 24 hours: Temp:  [98.1 F (36.7 C)-98.5 F (36.9 C)] 98.5 F (36.9 C) (10/26 0700) Pulse Rate:  [61-77] 70 (10/26 0700) Resp:  [17-18] 17 (10/26 0700) BP: (111-125)/(73-86) 113/84 (10/26 0700) SpO2:  [92 %-100 %] 92 % (10/26 0700) Weight:  [89 kg] 89 kg (10/26 0500) Last BM Date : 05/21/23 General:    white male in NAD Heart:  Regular rate and rhythm; no murmurs Lungs: Respirations even and unlabored, lungs CTA bilaterally Abdomen:  Soft, nontender and nondistended. Normal bowel sounds. Psych:  Cooperative. Normal mood and affect.  Lab Results: Recent Labs    05/20/23 1727 05/21/23 0818 05/22/23 0438  WBC 10.7* 8.5 7.6  HGB 15.2 14.1 13.4  HCT 45.6 42.8 39.7  PLT 261 275 240   BMET Recent Labs    05/20/23 1727 05/21/23 0818 05/22/23 0438  NA 134* 138  138 137  K 4.0 5.4*  5.6* 3.8  CL 97* 102  102 100  CO2 29 27  27 28   GLUCOSE 98 110*  109* 105*  BUN 14 9  9 9   CREATININE 0.95 0.94  0.91 0.91  CALCIUM 10.0 9.9  10.0 9.4      Latest Ref Rng & Units 05/22/2023    4:38 AM 05/21/2023    8:18 AM 05/20/2023    5:27 PM  Hepatic Function  Total Protein 6.5 - 8.1 g/dL 6.4  7.4    6.8  8.2   Albumin 3.5 - 5.0 g/dL 3.0  3.5    3.5  4.2   AST 15 - 41 U/L 579  635    677  538   ALT 0 - 44 U/L 1,142  1,220    1,270  1,166   Alk Phosphatase 38 - 126 U/L 192  213    220  242   Total Bilirubin 0.3 - 1.2 mg/dL 3.4  4.4    4.3  5.4      PT/INR Recent Labs    05/21/23 0818 05/22/23 0438  LABPROT 12.9 12.9  INR 1.0 1.0    Studies/Results: CT ABDOMEN PELVIS W CONTRAST  Result Date: 05/20/2023 CLINICAL DATA:  Acute nonlocalized abdominal pain, right upper quadrant/epigastric abdominal pain  EXAM: CT ABDOMEN AND PELVIS WITH CONTRAST TECHNIQUE: Multidetector CT imaging of the abdomen and pelvis was performed using the standard protocol following bolus administration of intravenous contrast. RADIATION DOSE REDUCTION: This exam was performed according to the departmental dose-optimization program which includes automated exposure control, adjustment of the mA and/or kV according to patient size and/or use of iterative reconstruction technique. CONTRAST:  OMNIPAQUE IOHEXOL 300 MG/ML  SOLN COMPARISON:  08/08/2022 FINDINGS: Lower chest: No acute abnormality. Hepatobiliary: No focal liver abnormality is seen. No gallstones, gallbladder wall thickening, or biliary dilatation. Pancreas: There is normal enhancement of the pancreatic parenchyma. The pancreatic duct is not dilated. There is, however, subtle peripancreatic inflammatory stranding surrounding the head of the pancreas extending into the adjacent mesenteric root, best seen on axial image # 23/2 and there is shotty peripancreatic and periportal adenopathy, best seen on coronal image # 58/5, which together may reflect changes of mild acute pancreatitis. No loculated peripancreatic fluid collections are identified. Spleen: Unremarkable Adrenals/Urinary Tract:  Adrenal glands are unremarkable. Kidneys are normal, without renal calculi, focal lesion, or hydronephrosis. Bladder is unremarkable. Stomach/Bowel: There is circumferential thickening and mild surrounding mesenteric inflammatory stranding involving the terminal ileum just proximal to the ileocecal junction best seen on axial image # 58/2 and coronal image # 62/5 in keeping with an infectious or inflammatory terminal ileitis. There is no evidence of obstruction or perforation. No free intraperitoneal gas or fluid. The stomach, small bowel, and large bowel are otherwise unremarkable. Appendix normal. Vascular/Lymphatic: The abdominal vasculature is unremarkable. No frankly pathologic adenopathy  within the abdomen and pelvis. Reproductive: Prostate is unremarkable. Other: No abdominal wall hernia Musculoskeletal: No acute bone abnormality. No lytic or blastic bone lesion. IMPRESSION: 1. Circumferential thickening and mild surrounding mesenteric inflammatory stranding involving the terminal ileum just proximal to the ileocecal junction in keeping with an infectious or inflammatory terminal ileitis. No evidence of obstruction or perforation. 2. Subtle peripancreatic inflammatory stranding surrounding the head of the pancreas extending into the adjacent mesenteric root with shotty peripancreatic and periportal adenopathy, which together may reflect changes of mild acute pancreatitis. Correlation with serum lipase may be helpful for further evaluation. Electronically Signed   By: Helyn Numbers M.D.   On: 05/20/2023 19:59     Assessment / Plan:   Assessment: 1.  Elevated LFTs with jaundice: LFTs trending down this morning; thought elated to possibly Celebrex +/- [place] 2.  Terminal ileitis: Asymptomatic, likely side effect from Celebrex 3.  Acute pancreatitis: Asymptomatic 4.  Generalized anxiety disorder  Plan: 1.  Would recommend 1 more night of admission.  Repeat LFTs in the a.m., if trending down can be discharged home with repeat LFTs in a week with his PCP or in our outpatient clinic. 2.  Hold Celebrex and hair supplement 3.  Additional liver serology labs are pending. 4.  Patient can have regular diet  Thank you for your kind consultation, we will continue to follow.    LOS: 1 day   Unk Lightning  05/22/2023, 11:40 AM

## 2023-05-22 NOTE — Progress Notes (Signed)
PROGRESS NOTE                                                                                                                                                                                                             Patient Demographics:    Stephen Reid, is a 37 y.o. male, DOB - 05-25-1986, JXB:[id number]  Outpatient Primary MD for the patient is Lula Olszewski, MD    LOS - 1  Admit date - 05/20/2023    Chief Complaint  Patient presents with   Abdominal Pain   Jaundice       Brief Narrative (HPI from H&P)    37 y.o. male with medical history significant of Presented to emergency department at [facility] with complaining of evaluation for abdominal pain.  Patient has epigastric and right upper quadrant abdominal pain which has been bothering him for some time however it has been getting worse intermittently for last couple of weeks.   He does not abuse alcohol, does not take Tylenol, uses Celebrex as needed for back pain maximum 1 a day, has been recently taking hair growth supplement called Nutrafol (has Ashwagandha) for the last 2 months, in the ER workup suggestive of transaminitis, jaundice, CT suggestive of possible pancreatitis and colitis and he was admitted.   Subjective:    Stephen Reid today has, No headache, No chest pain, No abdominal pain - No Nausea, No new weakness tingling or numbness, no SOB   Assessment  & Plan :    Transaminitis, Jaundice, possible mild pancreatitis.  Terminal ileitis on CT.   Unclear etiology, CT scan does suggest that he has terminal ileitis and possible mild pancreatitis, he is currently pain-free, does have significant elevation in his LFTs along with total bilirubin, does take a hair growth supplement Nutrafol (has Ashwagandha) unclear side effect profile, but known to have some liver toxicity in literature, ferritin somewhat elevated, otherwise anemia panel unremarkable.  No  history of IBD.  Thankfully LFTs are improving, stable INR, discussed with Dr. Adela Lank gastroenterologist on 05/22/2023 monitor another day, patient counseled to abstain from taking a supplement, if trend continues to improve discharge tomorrow.  He has no further diarrhea or any GI symptoms, completely pain-free.  Stop all antibiotics and monitor.  Possible mild pancreatitis.  Resolved with supportive care.   Essential hypertension  - Continue hydralazine  as needed.   Generalized anxiety disorder  -Continue Wellbutrin.   Hyperkalemia.  Lokelma and single dose Lasix.    Dyspepsia and epigastric pain  - PPI, checking stool H. pylori antigen.         Condition - Fair  Family Communication  : Family member bedside on 05/21/2023  Code Status :  Full  Consults  :  GI  PUD Prophylaxis : PPI   Procedures  :     CT -  1. Circumferential thickening and mild surrounding mesenteric inflammatory stranding involving the terminal ileum just proximal to the ileocecal junction in keeping with an infectious or inflammatory terminal ileitis. No evidence of obstruction or perforation. 2. Subtle peripancreatic inflammatory stranding surrounding the head of the pancreas extending into the adjacent mesenteric root with shotty peripancreatic and periportal adenopathy, which together may reflect changes of mild acute pancreatitis.      Disposition Plan  :    Status is: Observation   DVT Prophylaxis  :    heparin injection 5,000 Units Start: 05/21/23 0600 SCDs Start: 05/21/23 0141 Place TED hose Start: 05/21/23 0141    Lab Results  Component Value Date   PLT 240 05/22/2023    Diet :  Diet Order             Diet Heart Room service appropriate? Yes; Fluid consistency: Thin  Diet effective now                    Inpatient Medications  Scheduled Meds:  heparin  5,000 Units Subcutaneous Q8H   pantoprazole  40 mg Oral Daily   Continuous Infusions:   PRN Meds:.alum & mag  hydroxide-simeth, hydrALAZINE, ondansetron **OR** ondansetron (ZOFRAN) IV, senna-docusate, traMADol  Antibiotics  :    Anti-infectives (From admission, onward)    Start     Dose/Rate Route Frequency Ordered Stop   05/21/23 1315  metroNIDAZOLE (FLAGYL) tablet 500 mg  Status:  Discontinued        500 mg Oral Every 12 hours 05/21/23 1229 05/21/23 1528   05/21/23 0230  cefTRIAXone (ROCEPHIN) 2 g in sodium chloride 0.9 % 100 mL IVPB  Status:  Discontinued        2 g 200 mL/hr over 30 Minutes Intravenous Every 24 hours 05/21/23 0143 05/21/23 1456   05/21/23 0230  metroNIDAZOLE (FLAGYL) IVPB 500 mg  Status:  Discontinued        500 mg 100 mL/hr over 60 Minutes Intravenous Every 12 hours 05/21/23 0143 05/21/23 1229         Objective:   Vitals:   05/21/23 2046 05/22/23 0500 05/22/23 0556 05/22/23 0700  BP: 115/83  111/73 113/84  Pulse: 72  61 70  Resp: 18  18 17   Temp: 98.3 F (36.8 C)  98.1 F (36.7 C) 98.5 F (36.9 C)  TempSrc: Oral  Oral Oral  SpO2: 97%  98% 92%  Weight:  89 kg    Height:        Wt Readings from Last 3 Encounters:  05/22/23 89 kg  05/14/23 94.8 kg  08/28/22 104.1 kg    No intake or output data in the 24 hours ending 05/22/23 0813    Physical Exam  Awake Alert, No new F.N deficits, Normal affect [place].AT,PERRAL, mild scleral icterus Supple Neck, No JVD,   Symmetrical Chest wall movement, Good air movement bilaterally, CTAB RRR,No Gallops,Rubs or new Murmurs,  +ve B.Sounds, Abd Soft, No tenderness,   No Cyanosis, Clubbing or edema  Data Review:    Recent Labs  Lab 05/20/23 1727 05/21/23 0818 05/22/23 0438  WBC 10.7* 8.5 7.6  HGB 15.2 14.1 13.4  HCT 45.6 42.8 39.7  PLT 261 275 240  MCV 88.7 89.5 87.1  MCH 29.6 29.5 29.4  MCHC 33.3 32.9 33.8  RDW 13.5 13.4 13.3  LYMPHSABS  --  1.5 1.7  MONOABS  --  0.6 0.7  EOSABS  --  0.2 0.3  BASOSABS  --  0.0 0.0    Recent Labs  Lab 05/20/23 1727 05/21/23 0818 05/22/23 0438  NA 134* 138   138 137  K 4.0 5.4*  5.6* 3.8  CL 97* 102  102 100  CO2 29 27  27 28   ANIONGAP 8 9  9 9   GLUCOSE 98 110*  109* 105*  BUN 14 9  9 9   CREATININE 0.95 0.94  0.91 0.91  AST 538* 635*  677* 579*  ALT 1,166* 1,220*  1,270* 1,142*  ALKPHOS 242* 213*  220* 192*  BILITOT 5.4* 4.4*  4.3* 3.4*  ALBUMIN 4.2 3.5  3.5 3.0*  CRP  --  3.5* 2.4*  INR  --  1.0 1.0  MG  --  2.0 1.9  CALCIUM 10.0 9.9  10.0 9.4      Recent Labs  Lab 05/20/23 1727 05/21/23 0818 05/22/23 0438  CRP  --  3.5* 2.4*  INR  --  1.0 1.0  MG  --  2.0 1.9  CALCIUM 10.0 9.9  10.0 9.4    --------------------------------------------------------------------------------------------------------------- Lab Results  Component Value Date   CHOL 164 05/14/2023   HDL 48 05/14/2023   LDLCALC 97 05/14/2023   TRIG 101 05/14/2023   CHOLHDL 3.4 05/14/2023    No results found for: "HGBA1C" No results for input(s): "TSH", "T4TOTAL", "FREET4", "T3FREE", "THYROIDAB" in the last 72 hours. Recent Labs    05/21/23 0818  VITAMINB12 368  FOLATE 23.7  FERRITIN 952*  TIBC 350  IRON 101  RETICCTPCT 1.3    Radiology Reports CT ABDOMEN PELVIS W CONTRAST  Result Date: 05/20/2023 CLINICAL DATA:  Acute nonlocalized abdominal pain, right upper quadrant/epigastric abdominal pain EXAM: CT ABDOMEN AND PELVIS WITH CONTRAST TECHNIQUE: Multidetector CT imaging of the abdomen and pelvis was performed using the standard protocol following bolus administration of intravenous contrast. RADIATION DOSE REDUCTION: This exam was performed according to the departmental dose-optimization program which includes automated exposure control, adjustment of the mA and/or kV according to patient size and/or use of iterative reconstruction technique. CONTRAST:  OMNIPAQUE IOHEXOL 300 MG/ML  SOLN COMPARISON:  08/08/2022 FINDINGS: Lower chest: No acute abnormality. Hepatobiliary: No focal liver abnormality is seen. No gallstones, gallbladder  wall thickening, or biliary dilatation. Pancreas: There is normal enhancement of the pancreatic parenchyma. The pancreatic duct is not dilated. There is, however, subtle peripancreatic inflammatory stranding surrounding the head of the pancreas extending into the adjacent mesenteric root, best seen on axial image # 23/2 and there is shotty peripancreatic and periportal adenopathy, best seen on coronal image # 58/5, which together may reflect changes of mild acute pancreatitis. No loculated peripancreatic fluid collections are identified. Spleen: Unremarkable Adrenals/Urinary Tract: Adrenal glands are unremarkable. Kidneys are normal, without renal calculi, focal lesion, or hydronephrosis. Bladder is unremarkable. Stomach/Bowel: There is circumferential thickening and mild surrounding mesenteric inflammatory stranding involving the terminal ileum just proximal to the ileocecal junction best seen on axial image # 58/2 and coronal image # 62/5 in keeping with an infectious or inflammatory terminal ileitis. There is  no evidence of obstruction or perforation. No free intraperitoneal gas or fluid. The stomach, small bowel, and large bowel are otherwise unremarkable. Appendix normal. Vascular/Lymphatic: The abdominal vasculature is unremarkable. No frankly pathologic adenopathy within the abdomen and pelvis. Reproductive: Prostate is unremarkable. Other: No abdominal wall hernia Musculoskeletal: No acute bone abnormality. No lytic or blastic bone lesion. IMPRESSION: 1. Circumferential thickening and mild surrounding mesenteric inflammatory stranding involving the terminal ileum just proximal to the ileocecal junction in keeping with an infectious or inflammatory terminal ileitis. No evidence of obstruction or perforation. 2. Subtle peripancreatic inflammatory stranding surrounding the head of the pancreas extending into the adjacent mesenteric root with shotty peripancreatic and periportal adenopathy, which together may  reflect changes of mild acute pancreatitis. Correlation with serum lipase may be helpful for further evaluation. Electronically Signed   By: Helyn Numbers M.D.   On: 05/20/2023 19:59      Signature  -   Susa Raring M.D on 05/22/2023 at 8:13 AM   -  To page go to [email or web address]

## 2023-05-22 NOTE — Plan of Care (Signed)

## 2023-05-23 ENCOUNTER — Other Ambulatory Visit: Payer: Self-pay | Admitting: Gastroenterology

## 2023-05-23 ENCOUNTER — Encounter: Payer: Self-pay | Admitting: Internal Medicine

## 2023-05-23 DIAGNOSIS — R748 Abnormal levels of other serum enzymes: Secondary | ICD-10-CM

## 2023-05-23 DIAGNOSIS — B179 Acute viral hepatitis, unspecified: Secondary | ICD-10-CM | POA: Insufficient documentation

## 2023-05-23 DIAGNOSIS — R7401 Elevation of levels of liver transaminase levels: Secondary | ICD-10-CM | POA: Diagnosis not present

## 2023-05-23 DIAGNOSIS — R17 Unspecified jaundice: Secondary | ICD-10-CM | POA: Diagnosis not present

## 2023-05-23 DIAGNOSIS — K5000 Crohn's disease of small intestine without complications: Secondary | ICD-10-CM | POA: Diagnosis not present

## 2023-05-23 LAB — ANTI-SMOOTH MUSCLE ANTIBODY, IGG: F-Actin IgG: 6 U (ref 0–19)

## 2023-05-23 LAB — CMV IGM: CMV IgM: <30 [AU]/ml (ref 0.0–29.9)

## 2023-05-23 LAB — COMPREHENSIVE METABOLIC PANEL
ALT: 1262 U/L — ABNORMAL HIGH (ref 0–44)
AST: 598 U/L — ABNORMAL HIGH (ref 15–41)
Albumin: 3.4 g/dL — ABNORMAL LOW (ref 3.5–5.0)
Alkaline Phosphatase: 198 U/L — ABNORMAL HIGH (ref 38–126)
Anion gap: 9 (ref 5–15)
BUN: 11 mg/dL (ref 6–20)
CO2: 28 mmol/L (ref 22–32)
Calcium: 9.7 mg/dL (ref 8.9–10.3)
Chloride: 101 mmol/L (ref 98–111)
Creatinine, Ser: 0.9 mg/dL (ref 0.61–1.24)
GFR, Estimated: >60 mL/min (ref >60)
Glucose, Bld: 97 mg/dL (ref 70–99)
Potassium: 5 mmol/L (ref 3.5–5.1)
Sodium: 138 mmol/L (ref 135–145)
Total Bilirubin: 2.9 mg/dL — ABNORMAL HIGH (ref 0.3–1.2)
Total Protein: 7.3 g/dL (ref 6.5–8.1)

## 2023-05-23 LAB — CBC WITH DIFFERENTIAL/PLATELET
Abs Immature Granulocytes: 0.03 10*3/uL (ref 0.00–0.07)
Basophils Absolute: 0 10*3/uL (ref 0.0–0.1)
Basophils Relative: 0 %
Eosinophils Absolute: 0.2 10*3/uL (ref 0.0–0.5)
Eosinophils Relative: 2 %
HCT: 44.7 % (ref 39.0–52.0)
Hemoglobin: 14.8 g/dL (ref 13.0–17.0)
Immature Granulocytes: 0 %
Lymphocytes Relative: 23 %
Lymphs Abs: 1.9 10*3/uL (ref 0.7–4.0)
MCH: 29.8 pg (ref 26.0–34.0)
MCHC: 33.1 g/dL (ref 30.0–36.0)
MCV: 89.9 fL (ref 80.0–100.0)
Monocytes Absolute: 0.9 10*3/uL (ref 0.1–1.0)
Monocytes Relative: 11 %
Neutro Abs: 5.1 10*3/uL (ref 1.7–7.7)
Neutrophils Relative %: 64 %
Platelets: 280 10*3/uL (ref 150–400)
RBC: 4.97 MIL/uL (ref 4.22–5.81)
RDW: 13.2 % (ref 11.5–15.5)
WBC: 8.1 10*3/uL (ref 4.0–10.5)
nRBC: 0 % (ref 0.0–0.2)

## 2023-05-23 LAB — MAGNESIUM: Magnesium: 2 mg/dL (ref 1.7–2.4)

## 2023-05-23 LAB — HCV RNA QUANT: HCV Quantitative: NOT DETECTED [IU]/mL (ref >50)

## 2023-05-23 LAB — VARICELLA ZOSTER ANTIBODY, IGG: Varicella IgG: REACTIVE

## 2023-05-23 LAB — EPSTEIN-BARR VIRUS VCA, IGM: EBV VCA IgM: <36 U/mL (ref 0.0–35.9)

## 2023-05-23 LAB — ANA: Anti Nuclear Antibody (ANA): NEGATIVE

## 2023-05-23 LAB — PROTIME-INR
INR: 0.9 (ref 0.8–1.2)
Prothrombin Time: 12.8 s (ref 11.4–15.2)

## 2023-05-23 LAB — TISSUE TRANSGLUTAMINASE, IGA: Tissue Transglutaminase Ab, IgA: <2 U/mL (ref 0–3)

## 2023-05-23 LAB — TISSUE TRANSGLUTAMINASE, IGG: Tissue Transglut Ab: <2 U/mL (ref 0–5)

## 2023-05-23 LAB — EPSTEIN-BARR VIRUS VCA, IGG: EBV VCA IgG: >600 U/mL — ABNORMAL HIGH (ref 0.0–17.9)

## 2023-05-23 MED ORDER — SODIUM ZIRCONIUM CYCLOSILICATE 10 G PO PACK
10.0000 g | PACK | Freq: Once | ORAL | Status: AC
Start: 2023-05-23 — End: 2023-05-23
  Administered 2023-05-23: 10 g via ORAL
  Filled 2023-05-23: qty 1

## 2023-05-23 NOTE — Progress Notes (Signed)
    Progress Note   Subjective  Chief Complaint: Jaundice, abnormal CT of the abdomen and elevated LFTs  Bilirubin is still trending down overnight, AST and ALT with minimal increase, patient continues to feel completely well with no symptoms.  Up and around the room at time of my interview.  His wife is already thrown away the actual Gonda hair supplement.  Has plans to stop Celebrex.   Objective   Vital signs in last 24 hours: Temp:  [98.1 F (36.7 C)-98.6 F (37 C)] 98.2 F (36.8 C) (10/27 0720) Pulse Rate:  [66-78] 74 (10/27 0720) Resp:  [18] 18 (10/27 0720) BP: (113-140)/(76-88) 113/88 (10/27 0720) SpO2:  [99 %-100 %] 100 % (10/27 0720) Last BM Date : 05/21/23 General:    white male in NAD Heart:  Regular rate and rhythm; no murmurs Lungs: Respirations even and unlabored, lungs CTA bilaterally Abdomen:  Soft, nontender and nondistended. Normal bowel sounds. Psych:  Cooperative. Normal mood and affect.  Lab Results: Recent Labs    05/21/23 0818 05/22/23 0438 05/23/23 0821  WBC 8.5 7.6 8.1  HGB 14.1 13.4 14.8  HCT 42.8 39.7 44.7  PLT 275 240 280   BMET Recent Labs    05/21/23 0818 05/22/23 0438 05/23/23 0821  NA 138  138 137 138  K 5.4*  5.6* 3.8 5.0  CL 102  102 100 101  CO2 27  27 28 28   GLUCOSE 110*  109* 105* 97  BUN 9  9 9 11   CREATININE 0.94  0.91 0.91 0.90  CALCIUM 9.9  10.0 9.4 9.7      Latest Ref Rng & Units 05/23/2023    8:21 AM 05/22/2023    4:38 AM 05/21/2023    8:18 AM  Hepatic Function  Total Protein 6.5 - 8.1 g/dL 7.3  6.4  7.4    6.8   Albumin 3.5 - 5.0 g/dL 3.4  3.0  3.5    3.5   AST 15 - 41 U/L 598  579  635    677   ALT 0 - 44 U/L 1,262  1,142  1,220    1,270   Alk Phosphatase 38 - 126 U/L 198  192  213    220   Total Bilirubin 0.3 - 1.2 mg/dL 2.9  3.4  4.4    4.3      PT/INR Recent Labs    05/22/23 0438 05/23/23 0821  LABPROT 12.9 12.8  INR 1.0 0.9    Assessment / Plan:   Assessment: 1.  Elevated LFTs  with jaundice: LFTs trended down on 10/26, slight increase today and alk phos, AST and ALT, total bili still trending downward, completely normal PT/INR throughout this process, thought elated to possibly Celebrex +/- Ashwagonda, other liver serologies are still pending 2.  Terminal ileitis: Asymptomatic, likely side effect from Celebrex 3.  Acute pancreatitis: Asymptomatic 4.  Generalized anxiety disorder   Plan: 1.  At this point okay with patient discharge home.  We will keep a close eye on his LFTs once he leaves.  Will plan for repeat hepatic function panel and PT/INR on Tuesday, 05/25/2023 in our outpatient clinic.  Discussed where the patient needs to go and when he needs to get there.  He verbalized understanding.  We will sign off.  Thank you for your kind consultation.   LOS: 2 days   Unk Lightning  05/23/2023, 10:33 AM

## 2023-05-23 NOTE — Discharge Summary (Signed)
Stephen Reid ZOX:[id number] DOB: 09/20/1985 DOA: 05/20/2023  PCP: Lula Olszewski, MD  Admit date: 05/20/2023  Discharge date: 05/23/2023  Admitted From: Home   Disposition:  Home   Recommendations for Outpatient Follow-up:   Follow up with PCP in 1-2 weeks  PCP Please obtain BMP/CBC, 2 view CXR in 1week,  (see Discharge instructions)   PCP Please follow up on the following pending results: Monitor CBC, CMP closely, kindly follow-up on the hepatitis serological workup ordered and pending, needs close outpatient follow-up with GI for the next 3 to 4 weeks.  Will request GI and PCP to monitor H. pylori antigen levels, pending serological workup for hepatitis including HCVRNA, Leptospira antibodies, EBV serology, alpha-1 antitrypsin levels, transglutaminase IgG and IgM, anti-smooth muscle antibody, HSV DNA PCR, varicella and CMV serology, EBV serology.   Discharge diagnosis      Home Health: None Equipment/Devices: None  Consultations: GI Discharge Condition: Stable    CODE STATUS: Full    Diet Recommendation: Heart Healthy     Chief Complaint  Patient presents with   Abdominal Pain   Jaundice     Brief history of present illness from the day of admission and additional interim summary    37 y.o. male with medical history significant of Presented to emergency department at [facility] with complaining of evaluation for abdominal pain.  Patient has epigastric and right upper quadrant abdominal pain which has been bothering him for some time however it has been getting worse intermittently for last couple of weeks.    He does not abuse alcohol, does not take Tylenol, uses Celebrex as needed for back pain maximum 1 a day, has been recently taking hair growth supplement called Nutrafol (has Ashwagandha)  for the last 2 months, in the ER workup suggestive of transaminitis, jaundice, CT suggestive of possible pancreatitis and colitis and he was admitted.                                                                 Hospital Course   Transaminitis, Jaundice, possible mild pancreatitis.  Terminal ileitis on CT.   Unclear etiology, CT scan does suggest that he has terminal ileitis and possible mild pancreatitis, he is currently pain-free, does have significant elevation in his LFTs along with total bilirubin, does take a hair growth supplement Nutrafol (has Ashwagandha) unclear side effect profile, but known to have some liver toxicity in literature, ferritin somewhat elevated, otherwise anemia panel unremarkable.  No history of IBD.  Thankfully LFTs have stabilized and gradually improving bilirubin levels, stable INR, discussed with Dr. Adela Lank gastroenterologist on 05/23/2023, stable for home discharge with outpatient follow-up for with PCP and GI, patient counseled to abstain from taking a supplement, if trend continues to improve discharge tomorrow.  He has no further diarrhea or any  GI symptoms, completely pain-free.  Stop all antibiotics and monitor.   Possible mild pancreatitis.  Resolved with supportive care.   Essential hypertension  - Continue hydralazine as needed.   Generalized anxiety disorder  -Continue Wellbutrin.   Hyperkalemia.  Lokelma and single dose Lasix.     Dyspepsia and epigastric pain  - PPI, checking stool H. pylori antigen pending.  Will request GI and PCP to monitor H. pylori antigen levels, pending serological workup for hepatitis including HCVRNA, Leptospira antibodies, EBV serology, alpha-1 antitrypsin levels, transglutaminase IgG and IgM, anti-smooth muscle antibody, HSV DNA PCR, varicella and CMV serology, EBV serology.   Discharge diagnosis     Principal Problem:   Jaundice Active Problems:   Terminal ileitis (HCC)   Acute pancreatitis   Essential  hypertension   Transaminitis   GAD (generalized anxiety disorder)   Dyspepsia    Discharge instructions    Discharge Instructions     Diet - low sodium heart healthy   Complete by: As directed    Discharge instructions   Complete by: As directed    Follow with Primary MD Lula Olszewski, MD in 5-7 days follow up on the pending test results, stop all non prescription supplements - Nutrafol.  Get CBC, CMP, 2 view Chest X ray -  checked next visit with your primary MD    Activity: As tolerated with Full fall precautions use walker/cane & assistance as needed  Disposition Home    Diet: Heart Healthy    Special Instructions: If you have smoked or chewed Tobacco  in the last 2 yrs please stop smoking, stop any regular Alcohol  and or any Recreational drug use.  On your next visit with your primary care physician please Get Medicines reviewed and adjusted.  Please request your Prim.MD to go over all Hospital Tests and Procedure/Radiological results at the follow up, please get all Hospital records sent to your Prim MD by signing hospital release before you go home.  If you experience worsening of your admission symptoms, develop shortness of breath, life threatening emergency, suicidal or homicidal thoughts you must seek medical attention immediately by calling 911 or calling your MD immediately  if symptoms less severe.  You Must read complete instructions/literature along with all the possible adverse reactions/side effects for all the Medicines you take and that have been prescribed to you. Take any new Medicines after you have completely understood and accpet all the possible adverse reactions/side effects.   Increase activity slowly   Complete by: As directed        Discharge Medications   Allergies as of 05/23/2023   No Known Allergies      Medication List     STOP taking these medications    amLODipine 5 MG tablet Commonly known as: NORVASC   buPROPion ER 100  MG 12 hr tablet Commonly known as: WELLBUTRIN SR   celecoxib 100 MG capsule Commonly known as: CeleBREX   OVER THE COUNTER MEDICATION       TAKE these medications    ondansetron 4 MG disintegrating tablet Commonly known as: ZOFRAN-ODT Take 1 tablet (4 mg total) by mouth every 4 (four) hours as needed for nausea or vomiting.         Follow-up Information     Lula Olszewski, MD. Schedule an appointment as soon as possible for a visit in 1 week(s).   Specialty: Internal Medicine Contact information: [address] [place] [address] [phone]  Armbruster, Willaim Rayas, MD. Schedule an appointment as soon as possible for a visit in 1 week(s).   Specialty: Gastroenterology Contact information: [address] [place] [address] [place] [address] [phone]                 Major procedures and Radiology Reports - PLEASE review detailed and final reports thoroughly  -       CT ABDOMEN PELVIS W CONTRAST  Result Date: 05/20/2023 CLINICAL DATA:  Acute nonlocalized abdominal pain, right upper quadrant/epigastric abdominal pain EXAM: CT ABDOMEN AND PELVIS WITH CONTRAST TECHNIQUE: Multidetector CT imaging of the abdomen and pelvis was performed using the standard protocol following bolus administration of intravenous contrast. RADIATION DOSE REDUCTION: This exam was performed according to the departmental dose-optimization program which includes automated exposure control, adjustment of the mA and/or kV according to patient size and/or use of iterative reconstruction technique. CONTRAST:  OMNIPAQUE IOHEXOL 300 MG/ML  SOLN COMPARISON:  08/08/2022 FINDINGS: Lower chest: No acute abnormality. Hepatobiliary: No focal liver abnormality is seen. No gallstones, gallbladder wall thickening, or biliary dilatation. Pancreas: There is normal enhancement of the pancreatic parenchyma. The pancreatic duct is not dilated. There is, however, subtle peripancreatic  inflammatory stranding surrounding the head of the pancreas extending into the adjacent mesenteric root, best seen on axial image # 23/2 and there is shotty peripancreatic and periportal adenopathy, best seen on coronal image # 58/5, which together may reflect changes of mild acute pancreatitis. No loculated peripancreatic fluid collections are identified. Spleen: Unremarkable Adrenals/Urinary Tract: Adrenal glands are unremarkable. Kidneys are normal, without renal calculi, focal lesion, or hydronephrosis. Bladder is unremarkable. Stomach/Bowel: There is circumferential thickening and mild surrounding mesenteric inflammatory stranding involving the terminal ileum just proximal to the ileocecal junction best seen on axial image # 58/2 and coronal image # 62/5 in keeping with an infectious or inflammatory terminal ileitis. There is no evidence of obstruction or perforation. No free intraperitoneal gas or fluid. The stomach, small bowel, and large bowel are otherwise unremarkable. Appendix normal. Vascular/Lymphatic: The abdominal vasculature is unremarkable. No frankly pathologic adenopathy within the abdomen and pelvis. Reproductive: Prostate is unremarkable. Other: No abdominal wall hernia Musculoskeletal: No acute bone abnormality. No lytic or blastic bone lesion. IMPRESSION: 1. Circumferential thickening and mild surrounding mesenteric inflammatory stranding involving the terminal ileum just proximal to the ileocecal junction in keeping with an infectious or inflammatory terminal ileitis. No evidence of obstruction or perforation. 2. Subtle peripancreatic inflammatory stranding surrounding the head of the pancreas extending into the adjacent mesenteric root with shotty peripancreatic and periportal adenopathy, which together may reflect changes of mild acute pancreatitis. Correlation with serum lipase may be helpful for further evaluation. Electronically Signed   By: Helyn Numbers M.D.   On: 05/20/2023 19:59     Micro Results    Recent Results (from the past 240 hour(s))  Culture, blood (Routine X 2) w Reflex to ID Panel     Status: None (Preliminary result)   Collection Time: 05/21/23  8:18 AM   Specimen: BLOOD  Result Value Ref Range Status   Specimen Description BLOOD BLOOD RIGHT ARM  Final   Special Requests   Final    BOTTLES DRAWN AEROBIC AND ANAEROBIC Blood Culture adequate volume   Culture   Final    NO GROWTH 2 DAYS Performed at [facility], [address], [place], [place] [address]    Report Status PENDING  Incomplete  Culture, blood (Routine X 2) w Reflex to ID  Panel     Status: None (Preliminary result)   Collection Time: 05/21/23  8:20 AM   Specimen: BLOOD  Result Value Ref Range Status   Specimen Description BLOOD BLOOD RIGHT ARM  Final   Special Requests   Final    BOTTLES DRAWN AEROBIC AND ANAEROBIC Blood Culture adequate volume   Culture   Final    NO GROWTH 2 DAYS Performed at [facility], [address], [place], [place] [address]    Report Status PENDING  Incomplete  Gastrointestinal Panel by PCR , Stool     Status: None   Collection Time: 05/21/23  1:05 PM   Specimen: Stool  Result Value Ref Range Status   Campylobacter species NOT DETECTED NOT DETECTED Final   Plesimonas shigelloides NOT DETECTED NOT DETECTED Final   Salmonella species NOT DETECTED NOT DETECTED Final   Yersinia enterocolitica NOT DETECTED NOT DETECTED Final   Vibrio species NOT DETECTED NOT DETECTED Final   Vibrio cholerae NOT DETECTED NOT DETECTED Final   Enteroaggregative E coli (EAEC) NOT DETECTED NOT DETECTED Final   Enteropathogenic E coli (EPEC) NOT DETECTED NOT DETECTED Final   Enterotoxigenic E coli (ETEC) NOT DETECTED NOT DETECTED Final   Shiga like toxin producing E coli (STEC) NOT DETECTED NOT DETECTED Final   Shigella/Enteroinvasive E coli (EIEC) NOT DETECTED NOT DETECTED Final   Cryptosporidium NOT DETECTED NOT DETECTED Final   Cyclospora cayetanensis NOT  DETECTED NOT DETECTED Final   Entamoeba histolytica NOT DETECTED NOT DETECTED Final   Giardia lamblia NOT DETECTED NOT DETECTED Final   Adenovirus F40/41 NOT DETECTED NOT DETECTED Final   Astrovirus NOT DETECTED NOT DETECTED Final   Norovirus GI/GII NOT DETECTED NOT DETECTED Final   Rotavirus A NOT DETECTED NOT DETECTED Final   Sapovirus (I, II, IV, and V) NOT DETECTED NOT DETECTED Final    Comment: Performed at [facility], [address] [place], [place] [address]    Today   Subjective    Stephen Reid today has no headache,no chest abdominal pain,no new weakness tingling or numbness, feels much better wants to go home today.    Objective   Blood pressure 113/88, pulse 74, temperature 98.2 F (36.8 C), temperature source Oral, resp. rate 18, height 5\' 11"  (1.803 m), weight 89 kg, SpO2 100%.  No intake or output data in the 24 hours ending 05/23/23 1016  Exam  Awake Alert, No new F.N deficits,    [place].AT,PERRAL Supple Neck,   Symmetrical Chest wall movement, Good air movement bilaterally, CTAB RRR,No Gallops,   +ve B.Sounds, Abd Soft, Non tender,  No Cyanosis, Clubbing or edema    Data Review   Recent Labs  Lab 05/20/23 1727 05/21/23 0818 05/22/23 0438 05/23/23 0821  WBC 10.7* 8.5 7.6 8.1  HGB 15.2 14.1 13.4 14.8  HCT 45.6 42.8 39.7 44.7  PLT 261 275 240 280  MCV 88.7 89.5 87.1 89.9  MCH 29.6 29.5 29.4 29.8  MCHC 33.3 32.9 33.8 33.1  RDW 13.5 13.4 13.3 13.2  LYMPHSABS  --  1.5 1.7 1.9  MONOABS  --  0.6 0.7 0.9  EOSABS  --  0.2 0.3 0.2  BASOSABS  --  0.0 0.0 0.0    Recent Labs  Lab 05/20/23 1727 05/21/23 0818 05/22/23 0438 05/23/23 0821  NA 134* 138  138 137 138  K 4.0 5.4*  5.6* 3.8 5.0  CL 97* 102  102 100 101  CO2 29 27  27 28 28   ANIONGAP  8 9  9 9 9   GLUCOSE 98 110*  109* 105* 97  BUN 14 9  9 9 11   CREATININE 0.95 0.94  0.91 0.91 0.90  AST 538* 635*  677* 579* 598*  ALT 1,166* 1,220*  1,270* 1,142* 1,262*  ALKPHOS 242*  213*  220* 192* 198*  BILITOT 5.4* 4.4*  4.3* 3.4* 2.9*  ALBUMIN 4.2 3.5  3.5 3.0* 3.4*  CRP  --  3.5* 2.4*  --   INR  --  1.0 1.0 0.9  MG  --  2.0 1.9 2.0  CALCIUM 10.0 9.9  10.0 9.4 9.7    Total Time in preparing paper work, data evaluation and todays exam - 35 minutes  Signature  -    Susa Raring M.D on 05/23/2023 at 10:16 AM   -  To page go to [email or web address]

## 2023-05-23 NOTE — Discharge Instructions (Addendum)
Follow with Primary MD Lula Olszewski, MD in 5-7 days follow up on the pending test results, stop all non prescription supplements - Nutrafol.  Get CBC, CMP, 2 view Chest X ray -  checked next visit with your primary MD    Activity: As tolerated with Full fall precautions use walker/cane & assistance as needed  Disposition Home    Diet: Heart Healthy    Special Instructions: If you have smoked or chewed Tobacco  in the last 2 yrs please stop smoking, stop any regular Alcohol  and or any Recreational drug use.  On your next visit with your primary care physician please Get Medicines reviewed and adjusted.  Please request your Prim.MD to go over all Hospital Tests and Procedure/Radiological results at the follow up, please get all Hospital records sent to your Prim MD by signing hospital release before you go home.  If you experience worsening of your admission symptoms, develop shortness of breath, life threatening emergency, suicidal or homicidal thoughts you must seek medical attention immediately by calling 911 or calling your MD immediately  if symptoms less severe.  You Must read complete instructions/literature along with all the possible adverse reactions/side effects for all the Medicines you take and that have been prescribed to you. Take any new Medicines after you have completely understood and accpet all the possible adverse reactions/side effects.

## 2023-05-23 NOTE — Progress Notes (Signed)
Update our referral to gastrointestinal to be even more urgent as this patient has acute hepatitis, not resolving, with LFTs now over 1000 for about a week.  Ideally he gets in with [facility] in the next week.  Reviewed comprehensive labs and imaging from hospitalization 10/24-10/27/2024. Multiple issues identified: 1) Acute hepatitis with improving LFTs (ALT 1262, AST 598, T.bili 2.9) and stable INR, extensive viral workup negative, possible supplement-induced (Nutrafol); 2) Terminal ileitis on CT, infectious workup negative; 3) Mild pancreatitis resolving (lipase down to 103). Patient stable for discharge. Plan: Stop all supplements, weekly LFT monitoring, PCP f/u within 1 week, continue GI follow-up. Detailed explanation provided in Patient Message.

## 2023-05-24 ENCOUNTER — Telehealth: Payer: Self-pay | Admitting: *Deleted

## 2023-05-24 ENCOUNTER — Other Ambulatory Visit: Payer: Self-pay | Admitting: *Deleted

## 2023-05-24 DIAGNOSIS — R748 Abnormal levels of other serum enzymes: Secondary | ICD-10-CM

## 2023-05-24 LAB — H. PYLORI ANTIGEN, STOOL: H. Pylori Stool Ag, Eia: NEGATIVE

## 2023-05-24 LAB — VARICELLA ZOSTER ANTIBODY, IGM: Varicella-Zoster Ab, IgM: <0.91 {index} (ref 0.00–0.90)

## 2023-05-24 NOTE — Transitions of Care (Post Inpatient/ED Visit) (Signed)
   05/24/2023  Name: Stephen Reid MRN: [id number] DOB: 1986/05/03  Today's TOC FU Call Status: Today's TOC FU Call Status:: Successful TOC FU Call Completed TOC FU Call Complete Date: 05/24/23 Patient's Name and Date of Birth confirmed.  Transition Care Management Follow-up Telephone Call Date of Discharge: 05/23/23 Discharge Facility: [facility]) Type of Discharge: Inpatient Admission Primary Inpatient Discharge Diagnosis:: Jaundice How have you been since you were released from the hospital?: Better Any questions or concerns?: No  Items Reviewed: Did you receive and understand the discharge instructions provided?: Yes Medications obtained,verified, and reconciled?: Yes (Medications Reviewed) Any new allergies since your discharge?: No Dietary orders reviewed?: No Do you have support at home?: Yes People in Home: spouse Name of Support/Comfort Primary Source: Marchelle Folks  Medications Reviewed Today: Medications Reviewed Today     Reviewed by Luella Cook, RN (Case Manager) on 05/24/23 at 1449  Med List Status: <None>   Medication Order Taking? Sig Documenting Provider Last Dose Status Informant  ondansetron (ZOFRAN-ODT) 4 MG disintegrating tablet [id number] Yes Take 1 tablet (4 mg total) by mouth every 4 (four) hours as needed for nausea or vomiting. Lula Olszewski, MD Taking Active Self, Pharmacy Records            Home Care and Equipment/Supplies: Were Home Health Services Ordered?: NA Any new equipment or medical supplies ordered?: NA  Functional Questionnaire: Do you need assistance with bathing/showering or dressing?: No Do you need assistance with meal preparation?: No Do you need assistance with eating?: No Do you have difficulty maintaining continence: No Do you need assistance with getting out of bed/getting out of a chair/moving?: No Do you have difficulty managing or taking your medications?: No  Follow up appointments reviewed: PCP  Follow-up appointment confirmed?: Yes Date of PCP follow-up appointment?: 05/26/23 Follow-up Provider: Dr Glenetta Hew Specialist [facility] Follow-up appointment confirmed?: No Reason Specialist Follow-Up Not Confirmed: Patient has Specialist Provider Number and will Call for Appointment (Patient will call and make appt) Do you need transportation to your follow-up appointment?: No Do you understand care options if your condition(s) worsen?: Yes-patient verbalized understanding  SDOH Interventions Today    Flowsheet Row Most Recent Value  SDOH Interventions   Food Insecurity Interventions Intervention Not Indicated  Housing Interventions Intervention Not Indicated  Transportation Interventions Intervention Not Indicated  Utilities Interventions Intervention Not Indicated      Interventions Today    Flowsheet Row Most Recent Value  General Interventions   General Interventions Discussed/Reviewed General Interventions Discussed, General Interventions Reviewed, Doctor Visits  Doctor Visits Discussed/Reviewed Doctor Visits Discussed, Doctor Visits Reviewed  Pharmacy Interventions   Pharmacy Dicussed/Reviewed Pharmacy Topics Discussed      TOC Interventions Today    Flowsheet Row Most Recent Value  TOC Interventions   TOC Interventions Discussed/Reviewed TOC Interventions Discussed, TOC Interventions Reviewed      Patient declined further outreach follow up calls. Gean Maidens BSN RN [facility] Care Management [phone]

## 2023-05-24 NOTE — Telephone Encounter (Signed)
Called patient to notify of lab draws ordered via Ms. Lemmon,PA. Patient instructed where to go to have the labs drawn as well as the hours given and the location, in the basement of the [place] building. Patient states he will be having his labs done tomorrow.

## 2023-05-24 NOTE — Telephone Encounter (Signed)
Called patient who called into the office requesting that someone give him a call back with lab results. Patient stated that he does not remember calling in requesting that. When reviewing the labs he asked to stop me and stated that he was in the hospital for 2 days for that matter and why is he just now getting this call. He asked what was my supervisors name and if he could speak with him and/or her? Reached out to Hasna made her aware of what was going on and she gave me her extension to transfer call.

## 2023-05-24 NOTE — Telephone Encounter (Signed)
-----   Message from Unk Lightning sent at 05/23/2023 10:37 AM EDT ----- Regarding: Labs Please put in orders for Hepatic function panel and PT/INR on Tuesday 05/25/23 for Elevated LFT's in our outpatient lab.  Please call patient on Monday and remind him when and where to come.  Thanks-JLL

## 2023-05-25 ENCOUNTER — Encounter: Payer: Self-pay | Admitting: Internal Medicine

## 2023-05-25 ENCOUNTER — Other Ambulatory Visit (INDEPENDENT_AMBULATORY_CARE_PROVIDER_SITE_OTHER): Payer: 59

## 2023-05-25 DIAGNOSIS — R748 Abnormal levels of other serum enzymes: Secondary | ICD-10-CM

## 2023-05-25 LAB — CERULOPLASMIN: Ceruloplasmin: 40.9 mg/dL — ABNORMAL HIGH (ref 16.0–31.0)

## 2023-05-25 LAB — HEPATIC FUNCTION PANEL
ALT: 956 U/L — ABNORMAL HIGH (ref 0–53)
AST: 280 U/L — ABNORMAL HIGH (ref 0–37)
Albumin: 4.4 g/dL (ref 3.5–5.2)
Alkaline Phosphatase: 198 U/L — ABNORMAL HIGH (ref 39–117)
Bilirubin, Direct: 1.2 mg/dL — ABNORMAL HIGH (ref 0.0–0.3)
Total Bilirubin: 2.1 mg/dL — ABNORMAL HIGH (ref 0.2–1.2)
Total Protein: 7.9 g/dL (ref 6.0–8.3)

## 2023-05-25 LAB — ALPHA-1-ANTITRYPSIN: A-1 Antitrypsin, Ser: 192 mg/dL — ABNORMAL HIGH (ref 95–164)

## 2023-05-25 LAB — PROTIME-INR
INR: 1 {ratio} (ref 0.8–1.0)
Prothrombin Time: 11 s (ref 9.6–13.1)

## 2023-05-25 LAB — IGG: IgG (Immunoglobin G), Serum: 1036 mg/dL (ref 603–1613)

## 2023-05-25 NOTE — Progress Notes (Signed)
Reviewed latest labs (05/25/23): Liver tests continue to improve with bilirubin down to 2.1, ALT 956, AST 280. H. pylori negative. Additional testing shows elevated inflammatory markers (A1AT 192, ceruloplasmin 40.9) suggesting inflammatory response. Continue weekly monitoring. Celiac screen negative. Plan reviewed in detail in Patient Message.

## 2023-05-26 ENCOUNTER — Ambulatory Visit: Payer: 59 | Admitting: Internal Medicine

## 2023-05-26 ENCOUNTER — Encounter: Payer: Self-pay | Admitting: Internal Medicine

## 2023-05-26 VITALS — BP 123/78 | HR 109 | Temp 98.4°F | Ht 71.0 in | Wt 197.0 lb

## 2023-05-26 DIAGNOSIS — K859 Acute pancreatitis without necrosis or infection, unspecified: Secondary | ICD-10-CM | POA: Diagnosis not present

## 2023-05-26 DIAGNOSIS — B179 Acute viral hepatitis, unspecified: Secondary | ICD-10-CM | POA: Diagnosis not present

## 2023-05-26 DIAGNOSIS — K5000 Crohn's disease of small intestine without complications: Secondary | ICD-10-CM

## 2023-05-26 DIAGNOSIS — R5383 Other fatigue: Secondary | ICD-10-CM | POA: Diagnosis not present

## 2023-05-26 DIAGNOSIS — R7401 Elevation of levels of liver transaminase levels: Secondary | ICD-10-CM | POA: Diagnosis not present

## 2023-05-26 DIAGNOSIS — R17 Unspecified jaundice: Secondary | ICD-10-CM

## 2023-05-26 LAB — SEDIMENTATION RATE: Sed Rate: 33 mm/h — ABNORMAL HIGH (ref 0–15)

## 2023-05-26 LAB — COMPREHENSIVE METABOLIC PANEL
ALT: 909 U/L — ABNORMAL HIGH (ref 0–53)
AST: 321 U/L — ABNORMAL HIGH (ref 0–37)
Albumin: 4.2 g/dL (ref 3.5–5.2)
Alkaline Phosphatase: 210 U/L — ABNORMAL HIGH (ref 39–117)
BUN: 10 mg/dL (ref 6–23)
CO2: 31 meq/L (ref 19–32)
Calcium: 10.3 mg/dL (ref 8.4–10.5)
Chloride: 101 meq/L (ref 96–112)
Creatinine, Ser: 0.84 mg/dL (ref 0.40–1.50)
GFR: 109.96 mL/min (ref >60.00)
Glucose, Bld: 92 mg/dL (ref 70–99)
Potassium: 5.1 meq/L (ref 3.5–5.1)
Sodium: 140 meq/L (ref 135–145)
Total Bilirubin: 2.1 mg/dL — ABNORMAL HIGH (ref 0.2–1.2)
Total Protein: 7.3 g/dL (ref 6.0–8.3)

## 2023-05-26 LAB — CBC WITH DIFFERENTIAL/PLATELET
Basophils Absolute: 0.1 10*3/uL (ref 0.0–0.1)
Basophils Relative: 0.9 % (ref 0.0–3.0)
Eosinophils Absolute: 0.2 10*3/uL (ref 0.0–0.7)
Eosinophils Relative: 2.5 % (ref 0.0–5.0)
HCT: 46 % (ref 39.0–52.0)
Hemoglobin: 15 g/dL (ref 13.0–17.0)
Lymphocytes Relative: 22.3 % (ref 12.0–46.0)
Lymphs Abs: 1.6 10*3/uL (ref 0.7–4.0)
MCHC: 32.6 g/dL (ref 30.0–36.0)
MCV: 90.9 fL (ref 78.0–100.0)
Monocytes Absolute: 0.8 10*3/uL (ref 0.1–1.0)
Monocytes Relative: 10.3 % (ref 3.0–12.0)
Neutro Abs: 4.7 10*3/uL (ref 1.4–7.7)
Neutrophils Relative %: 64 % (ref 43.0–77.0)
Platelets: 361 10*3/uL (ref 150.0–400.0)
RBC: 5.06 Mil/uL (ref 4.22–5.81)
RDW: 13.4 % (ref 11.5–15.5)
WBC: 7.3 10*3/uL (ref 4.0–10.5)

## 2023-05-26 LAB — LIPID PANEL
Cholesterol: 178 mg/dL (ref 0–200)
HDL: 31.9 mg/dL — ABNORMAL LOW (ref >39.00)
LDL Cholesterol: 102 mg/dL — ABNORMAL HIGH (ref 0–99)
NonHDL: 146.56
Total CHOL/HDL Ratio: 6
Triglycerides: 223 mg/dL — ABNORMAL HIGH (ref 0.0–149.0)
VLDL: 44.6 mg/dL — ABNORMAL HIGH (ref 0.0–40.0)

## 2023-05-26 LAB — CULTURE, BLOOD (ROUTINE X 2)
Culture: NO GROWTH
Culture: NO GROWTH
Special Requests: ADEQUATE
Special Requests: ADEQUATE

## 2023-05-26 LAB — C-REACTIVE PROTEIN: CRP: 1.2 mg/dL (ref 0.5–20.0)

## 2023-05-26 LAB — CORTISOL: Cortisol, Plasma: 10.1 ug/dL

## 2023-05-26 LAB — GAMMA GT: GGT: 89 U/L — ABNORMAL HIGH (ref 7–51)

## 2023-05-26 LAB — B12 AND FOLATE PANEL
Folate: 16.5 ng/mL (ref >5.9)
Vitamin B-12: 365 pg/mL (ref 211–911)

## 2023-05-26 LAB — AMYLASE: Amylase: 71 U/L (ref 27–131)

## 2023-05-26 LAB — LIPASE: Lipase: 133 U/L — ABNORMAL HIGH (ref 11.0–59.0)

## 2023-05-26 NOTE — Assessment & Plan Note (Signed)
No pain, tolerating food, convalescing, recheck labs

## 2023-05-26 NOTE — Patient Instructions (Addendum)
VISIT SUMMARY:  You came in today for a follow-up visit after your recent hospitalization for severe hepatitis, pancreatitis, and terminal ileitis. You have stopped taking the supplement that likely caused your hepatitis. You mentioned feeling very tired, especially in the mornings and around lunchtime, but this improves in the afternoon. You also noted mild jaundice but no pain or tenderness in your liver area. Your previous severe upper abdominal pain has resolved.  YOUR PLAN:  -SEVERE HEPATITIS: Severe hepatitis is a serious inflammation of the liver. It is likely that your hepatitis was caused by an ashwagandha supplement. Your liver function tests are improving since you stopped taking the supplement. We will continue to monitor your liver function tests weekly until they return to normal and will also conduct a comprehensive lab workup to investigate your persistent fatigue.  -POSSIBLE CROHN'S DISEASE: Crohn's disease is a type of inflammatory bowel disease that can affect any part of the gastrointestinal tract. The inflammation in your terminal ileum and mild pancreatitis observed on your CT scan suggest this condition. We have scheduled a GI referral for a colonoscopy in January to confirm or rule out this diagnosis.  -FATIGUE: Fatigue is a feeling of extreme tiredness and lack of energy. Your fatigue is likely due to your recent illness and hospitalization. We will conduct a comprehensive lab workup to explore potential causes. Continue following the Mediterranean diet and stay hydrated to support your overall health and energy levels.  INSTRUCTIONS:  We will schedule your next appointment in 3-6 months based on your lab results and the resolution of your fatigue. If your fatigue persists or worsens, please return sooner.  Dietary Recommendations for Liver Disease  General Guidelines: - Eat small, frequent meals - Limit high-fat, fried, and sugary foods - Avoid alcohol - Stay hydrated  with water  Recommended Foods: - Lean proteins (chicken, fish, eggs) - Complex carbohydrates (whole grains, fruits, vegetables) - Low-fat dairy - Healthy fats (olive oil, avocados, nuts)  Foods to Limit: - Red meat - High-sodium foods - Processed meats (deli, sausage) - High-fat dairy products - Sugary drinks and sweets  Other Tips: - Eat fiber-rich foods to help digestion - Avoid raw or undercooked shellfish - Monitor protein, sodium, and fluid intake as directed by your doctor - Stay at a healthy weight  Let me know if you have any other questions!

## 2023-05-26 NOTE — Telephone Encounter (Signed)
Patient's review of lab results/notes letter confirmed.

## 2023-05-26 NOTE — Assessment & Plan Note (Signed)
resolving

## 2023-05-26 NOTE — Assessment & Plan Note (Signed)
Mild pancreatitis and terminal ileitis observed on CT scan suggest Crohn's disease. We will maintain the planned GI referral for a colonoscopy in January to confirm or rule out the diagnosis.

## 2023-05-26 NOTE — Progress Notes (Signed)
[facility]: [phone]   -- Medical Office Visit --  Patient:  Stephen Reid      Age: 37 y.o.       Sex:  male  Date:   05/26/2023 Today's Healthcare Provider: Lula Olszewski, MD  ============================================================================================= Assessment Plan    Assessment & Plan Other fatigue His new onset fatigue likely stems from recent illness and hospitalization. A comprehensive lab workup will be ordered to explore potential causes. He should continue with the Mediterranean diet and hydration to support overall health and energy levels. Acute hepatitis Liver tests continue to improve with bilirubin down to 2.1, ALT 956, AST 280. H. pylori negative. Additional testing shows elevated inflammatory markers (A1AT 192, ceruloplasmin 40.9) suggesting inflammatory response. Continue weekly monitoring. Celiac screen negative. Plan reviewed in detail in Patient Message.   Despite improvements we will continue to try to expedite gastrointestinal ref and I'm sharing this note with referral coordinator to assist, due to this coexisted with  terminal ileitis suspicious for crohn's so I'd like a sooner colonoscopy.  Likely due to ashwagandha supplement use, his liver function tests show improvement following the supplement's discontinuation, with no signs of advanced liver failure observed. We will continue monitoring liver function tests weekly until he normalizes and order a comprehensive lab workup for persistent fatigue.  Acute pancreatitis, unspecified complication status, unspecified pancreatitis type No pain, tolerating food, convalescing, recheck labs Transaminitis Continue(s) with plan for weekly monitoring  Terminal ileitis without complication (HCC) Mild pancreatitis and terminal ileitis observed on CT scan suggest Crohn's disease. We will maintain the planned GI referral for a colonoscopy in January to confirm or rule out the  diagnosis.  Jaundice resolving  Diagnoses and all orders for this visit: Other fatigue -     Gamma GT -     Iron, TIBC and Ferritin Panel -     B12 and Folate Panel -     Sedimentation rate -     Cortisol -     C-reactive protein -     Lipid panel -     Comprehensive metabolic panel -     CBC with Differential/Platelet -     TSH Rfx on Abnormal to Free T4 -     Lipid Panel w/reflex Direct LDL Acute hepatitis -     Gamma GT -     Iron, TIBC and Ferritin Panel -     B12 and Folate Panel -     Sedimentation rate -     Cortisol -     C-reactive protein -     Lipid panel -     Comprehensive metabolic panel -     CBC with Differential/Platelet -     TSH Rfx on Abnormal to Free T4 -     Lipid Panel w/reflex Direct LDL Acute pancreatitis, unspecified complication status, unspecified pancreatitis type -     Gamma GT -     Iron, TIBC and Ferritin Panel -     B12 and Folate Panel -     Sedimentation rate -     Cortisol -     C-reactive protein -     Lipid panel -     Comprehensive metabolic panel -     CBC with Differential/Platelet -     TSH Rfx on Abnormal to Free T4 -     Lipid Panel w/reflex Direct LDL Transaminitis -     Gamma GT -     Iron, TIBC and  Ferritin Panel -     B12 and Folate Panel -     Sedimentation rate -     Cortisol -     C-reactive protein -     Lipid panel -     Comprehensive metabolic panel -     CBC with Differential/Platelet -     TSH Rfx on Abnormal to Free T4 -     Lipid Panel w/reflex Direct LDL Terminal ileitis without complication (HCC) -     Gamma GT -     Iron, TIBC and Ferritin Panel -     B12 and Folate Panel -     Sedimentation rate -     Cortisol -     C-reactive protein -     Lipid panel -     Comprehensive metabolic panel -     CBC with Differential/Platelet -     TSH Rfx on Abnormal to Free T4 -     Lipid Panel w/reflex Direct LDL Jaundice -     Gamma GT -     Iron, TIBC and Ferritin Panel -     B12 and Folate Panel -      Sedimentation rate -     Cortisol -     C-reactive protein -     Lipid panel -     Comprehensive metabolic panel -     CBC with Differential/Platelet -     TSH Rfx on Abnormal to Free T4 -     Lipid Panel w/reflex Direct LDL  Recommended follow-up: Based on lab results and the resolution of fatigue, the next appointment will be scheduled in 3-6 months. Should the fatigue persist or worsen, he is to return sooner.  Future Appointments  Date Time Provider Department Center  08/20/2023  9:00 AM Javier Glazier [facility]  08/30/2023  2:00 PM Lula Olszewski, MD [facility]  Patient Care Team: Lula Olszewski, MD as PCP - General (Internal Medicine)    Subjective   37 y.o. male who has Tobacco dependence; Chronic pain of left ankle; GERD (gastroesophageal reflux disease); Weight disorder; Chronic back pain; Essential hypertension; Alcoholic liver disease (HCC); Depression; Jaundice; Transaminitis; Terminal ileitis (HCC); Acute pancreatitis; GAD (generalized anxiety disorder); Dyspepsia; and Acute hepatitis on their problem list.. Main reasons for visit/main concerns/chief complaint: Hospitalization Follow-up (Went to ED on 10/24.)   ------------------------------------------------------------------------------------------------------------------------ AI-Extracted: Discussed the use of AI scribe software for clinical note transcription with the patient, who gave verbal consent to proceed.  History of Present Illness   The patient, with a recent history of severe hepatitis, pancreatitis, and terminal ileitis, presents for a follow-up consultation post-hospitalization. The hepatitis is presumed to be due to ashwagandha, an ingredient in a supplement the patient was taking for hair, skin, and nails. The patient has since discontinued the supplement.  The patient reports significant fatigue that started after discharge from the hospital. The fatigue is most severe in the  morning and around lunchtime, but tends to improve in the afternoon. The patient denies any pain but notes persistent mild jaundice.  Previously, the patient experienced severe upper abdominal pain, which has since resolved. The patient denies any tenderness in the liver area. The patient has a remote history of alcohol use hepatitis.  The patient has been following a Mediterranean diet and reports no dietary issues. There is a suspicion of Crohn's disease due to the inflammation noted in the terminal ileum on a previous CT scan.  Note that patient  has a past medical history of Abdominal pain with vomiting (08/09/2022), Alcohol dependence (HCC), Cellulitis of left ankle (03/15/2020), GERD (gastroesophageal reflux disease) (08/28/2022), Hypertension, Marijuana abuse (08/09/2022), Obese (08/28/2022), and Pain in left foot (03/05/2020).  Problem list overviews that were updated at today's visit: Problem  Acute Hepatitis   Latest labs (05/25/2023) show continued improvement:  Total bilirubin down to 2.1 from 2.9 mg/dL ALT 829 (down from 5,621) AST 280 (down from 598)   New diagnostic findings:  Alpha-1 Antitrypsin elevated at 192 mg/dL (ref: 30-865) Ceruloplasmin elevated at 40.9 mg/dL (ref: 78-46) H. pylori stool antigen negative Celiac disease screening negative Varicella IgM negative   Assessment:  Pattern suggests inflammatory response rather than viral or autoimmune etiology Acute phase reactants elevated (A1AT, ceruloplasmin) Liver synthetic function preserved (albumin 4.4)   Plan:  Continue weekly LFT monitoring GI follow-up to discuss elevated acute phase proteins Maintain supplement cessation Weekly follow-up until normalized    Acute Hepatitis with Jaundice (05/20/2023) - Status: Active/Improving  Initial presentation with jaundice, dark urine, elevated LFTs CT abdomen/pelvis (05/20/2023): No focal liver abnormality Lab Trends:  ALT: 1,166 ? 1,270 ? 1,262  U/L (trending stable) AST: 538 ? 677 ? 598 U/L (trending down) Total Bilirubin: 5.4 ? 4.3 ? 2.9 mg/dL (improving) INR stable at 0.9-1.0   Extensive workup negative for viral causes:  Negative: Hep A IgM, Hep B core IgM, HCV RNA, HIV EBV VCA IgM negative, IgG >600 CMV IgM negative   Possible contributing factors:  Nutrafol supplement (contains Ashwagandha) Recent history of gastroenteritis   Plan:  Discontinue all supplements Weekly LFT monitoring GI follow-up PCP follow-up within 1 week     Med reconciliation: Current Outpatient Medications on File Prior to Visit  Medication Sig   ondansetron (ZOFRAN-ODT) 4 MG disintegrating tablet Take 1 tablet (4 mg total) by mouth every 4 (four) hours as needed for nausea or vomiting.   No current facility-administered medications on file prior to visit.  There are no discontinued medications.   Objective   Physical Exam  BP 123/78 (BP Location: Left Arm, Patient Position: Sitting)   Pulse (!) 109   Temp 98.4 F (36.9 C) (Temporal)   Ht 5\' 11"  (1.803 m)   Wt 197 lb (89.4 kg)   SpO2 96%   BMI 27.48 kg/m  Wt Readings from Last 10 Encounters:  05/26/23 197 lb (89.4 kg)  05/22/23 196 lb 3.4 oz (89 kg)  05/14/23 209 lb (94.8 kg)  08/28/22 229 lb 6.4 oz (104.1 kg)  08/09/22 241 lb 2.9 oz (109.4 kg)  10/29/11 194 lb (88 kg)   Vital signs reviewed.  Nursing notes reviewed. Weight trend reviewed. Abnormalities and Problem-Specific physical exam findings:  improved but not resolved icterus, no abdomen pain, fatigued weight loss for the year approaching 50 pounds.stretch marks arms are not purple.  General Appearance:  No acute distress appreciable.   Well-groomed, healthy-appearing male.  Well proportioned with no abnormal fat distribution.  Good muscle tone. Pulmonary:  Normal work of breathing at rest, no respiratory distress apparent. SpO2: 96 %  Musculoskeletal: All extremities are intact.  Neurological:  Awake, alert,  oriented, and engaged.  No obvious focal neurological deficits or cognitive impairments.  Sensorium seems unclouded.   Speech is clear and coherent with logical content. Psychiatric:  Appropriate mood, pleasant and cooperative demeanor, thoughtful and engaged during the exam  Results   LABS ALT: 1000+ U/L  RADIOLOGY CT scan: Inflamed terminal ileum  No results found for any visits on 05/26/23.  Appointment on 05/25/2023  Component Date Value   INR 05/25/2023 1.0    Prothrombin Time 05/25/2023 11.0    Total Bilirubin 05/25/2023 2.1 (H)    Bilirubin, Direct 05/25/2023 1.2 (H)    Alkaline Phosphatase 05/25/2023 198 (H)    AST 05/25/2023 280 (H)    ALT 05/25/2023 956 (H)    Total Protein 05/25/2023 7.9    Albumin 05/25/2023 4.4   No results displayed because visit has over 200 results.    Office Visit on 05/14/2023  Component Date Value   Color, Urine 05/14/2023 YELLOW    APPearance 05/14/2023 CLEAR    Specific Gravity, Urine 05/14/2023 <=1.005 (A)    pH 05/14/2023 6.5    Total Protein, Urine 05/14/2023 NEGATIVE    Urine Glucose 05/14/2023 NEGATIVE    Ketones, ur 05/14/2023 NEGATIVE    Bilirubin Urine 05/14/2023 SMALL (A)    Hgb urine dipstick 05/14/2023 NEGATIVE    Urobilinogen, UA 05/14/2023 0.2    Leukocytes,Ua 05/14/2023 NEGATIVE    Nitrite 05/14/2023 NEGATIVE    WBC, UA 05/14/2023 none seen    RBC / HPF 05/14/2023 none seen    AST 05/14/2023 234 (H)    ALT 05/14/2023 668 (HH)    Platelets 05/14/2023 249    FIB-4 Index 05/14/2023 1.42    Cholesterol 05/14/2023 161    Triglycerides 05/14/2023 98.0    HDL 05/14/2023 46.90    VLDL 05/14/2023 19.6    LDL Cholesterol 05/14/2023 94    Total CHOL/HDL Ratio 05/14/2023 3    NonHDL 05/14/2023 114.03    Sodium 05/14/2023 135    Potassium 05/14/2023 4.4    Chloride 05/14/2023 96    CO2 05/14/2023 30    Glucose, Bld 05/14/2023 93    BUN 05/14/2023 9    Creatinine, Ser 05/14/2023 0.87    Total Bilirubin 05/14/2023  4.1 (H)    Alkaline Phosphatase 05/14/2023 220 (H)    AST 05/14/2023 207 (H)    ALT 05/14/2023 609 (H)    Total Protein 05/14/2023 7.6    Albumin 05/14/2023 4.4    GFR 05/14/2023 108.82    Calcium 05/14/2023 10.3    WBC 05/14/2023 8.5    RBC 05/14/2023 5.22    Hemoglobin 05/14/2023 15.4    HCT 05/14/2023 46.4    MCV 05/14/2023 88.9    MCHC 05/14/2023 33.1    RDW 05/14/2023 13.3    Platelets 05/14/2023 240.0    Neutrophils Relative % 05/14/2023 65.5    Lymphocytes Relative 05/14/2023 22.7    Monocytes Relative 05/14/2023 9.1    Eosinophils Relative 05/14/2023 2.3    Basophils Relative 05/14/2023 0.4    Neutro Abs 05/14/2023 5.6    Lymphs Abs 05/14/2023 1.9    Monocytes Absolute 05/14/2023 0.8    Eosinophils Absolute 05/14/2023 0.2    Basophils Absolute 05/14/2023 0.0    TSH 05/14/2023 1.140    Cholesterol 05/14/2023 164    HDL 05/14/2023 48    Triglycerides 05/14/2023 101    LDL Cholesterol (Calc) 05/14/2023 97    Total CHOL/HDL Ratio 05/14/2023 3.4    Non-HDL Cholesterol (Cal* 05/14/2023 116    GGT 05/14/2023 430 (H)    Total Bilirubin 05/14/2023 4.4 (H)    Bilirubin, Direct 05/14/2023 3.0 (H)    Indirect Bilirubin 05/14/2023 1.4 (H)    Color, UA 05/14/2023 dark yellow    Clarity, UA 05/14/2023 clear    Glucose, UA 05/14/2023 Negative    Bilirubin, UA 05/14/2023  Negative    Ketones, UA 05/14/2023 Negative    Spec Grav, UA 05/14/2023 1.010    Blood, UA 05/14/2023 Negative    pH, UA 05/14/2023 6.0    Protein, UA 05/14/2023 Negative    Urobilinogen, UA 05/14/2023 0.2    Nitrite, UA 05/14/2023 Negative    Leukocytes, UA 05/14/2023 Negative    ELF(TM) Score 05/14/2023 9.59   Admission on 08/08/2022, Discharged on 08/12/2022  Component Date Value   Lipase 08/08/2022 30    Sodium 08/08/2022 140    Potassium 08/08/2022 3.9    Chloride 08/08/2022 102    CO2 08/08/2022 23    Glucose, Bld 08/08/2022 123 (H)    BUN 08/08/2022 8    Creatinine, Ser 08/08/2022 0.83     Calcium 08/08/2022 9.5    Total Protein 08/08/2022 7.9    Albumin 08/08/2022 4.2    AST 08/08/2022 106 (H)    ALT 08/08/2022 88 (H)    Alkaline Phosphatase 08/08/2022 99    Total Bilirubin 08/08/2022 0.9    GFR, Estimated 08/08/2022 >60    Anion gap 08/08/2022 15    WBC 08/08/2022 21.7 (H)    RBC 08/08/2022 5.25    Hemoglobin 08/08/2022 18.0 (H)    HCT 08/08/2022 51.5    MCV 08/08/2022 98.1    MCH 08/08/2022 34.3 (H)    MCHC 08/08/2022 35.0    RDW 08/08/2022 12.7    Platelets 08/08/2022 226    nRBC 08/08/2022 0.0    Color, Urine 08/08/2022 YELLOW    APPearance 08/08/2022 CLEAR    Specific Gravity, Urine 08/08/2022 >1.046 (H)    pH 08/08/2022 6.5    Glucose, UA 08/08/2022 NEGATIVE    Hgb urine dipstick 08/08/2022 NEGATIVE    Bilirubin Urine 08/08/2022 NEGATIVE    Ketones, ur 08/08/2022 NEGATIVE    Protein, ur 08/08/2022 TRACE (A)    Nitrite 08/08/2022 NEGATIVE    Leukocytes,Ua 08/08/2022 NEGATIVE    RBC / HPF 08/08/2022 0-5    WBC, UA 08/08/2022 0-5    Bacteria, UA 08/08/2022 NONE SEEN    Squamous Epithelial / HPF 08/08/2022 0-5    Mucus 08/08/2022 PRESENT    Lactic Acid, Venous 08/08/2022 2.5 (HH)    Lactic Acid, Venous 08/08/2022 1.3    Specimen Description 08/08/2022                     Value:BLOOD LEFT ANTECUBITAL Performed at [facility], [address], [place], [place] [address]    Special Requests 08/08/2022                     Value:BOTTLES DRAWN AEROBIC AND ANAEROBIC Blood Culture adequate volume Performed at [facility], [address] [place], [place] [address]    Culture 08/08/2022                     Value:NO GROWTH 5 DAYS Performed at [facility], [address], [place], [place] [address]    Report Status 08/08/2022 08/13/2022 FINAL    Troponin I (High Sensiti* 08/08/2022 4    Adenovirus 08/08/2022 NOT DETECTED    Coronavirus 229E 08/08/2022 NOT DETECTED    Coronavirus HKU1 08/08/2022 NOT DETECTED    Coronavirus  NL63 08/08/2022 NOT DETECTED    Coronavirus OC43 08/08/2022 NOT DETECTED    Metapneumovirus 08/08/2022 NOT DETECTED    Rhinovirus / Enterovirus 08/08/2022 NOT DETECTED    Influenza A 08/08/2022 NOT DETECTED    Influenza  B 08/08/2022 NOT DETECTED    Parainfluenza Virus 1 08/08/2022 NOT DETECTED    Parainfluenza Virus 2 08/08/2022 NOT DETECTED    Parainfluenza Virus 3 08/08/2022 NOT DETECTED    Parainfluenza Virus 4 08/08/2022 NOT DETECTED    Respiratory Syncytial Vi* 08/08/2022 NOT DETECTED    Bordetella pertussis 08/08/2022 NOT DETECTED    Bordetella Parapertussis 08/08/2022 NOT DETECTED    Chlamydophila pneumoniae 08/08/2022 NOT DETECTED    Mycoplasma pneumoniae 08/08/2022 NOT DETECTED    SARS Coronavirus 2 by RT* 08/08/2022 NEGATIVE    Influenza A by PCR 08/08/2022 NEGATIVE    Influenza B by PCR 08/08/2022 NEGATIVE    Resp Syncytial Virus by * 08/08/2022 NEGATIVE    HIV Screen 4th Generatio* 08/09/2022 Non Reactive    Opiates 08/09/2022 NONE DETECTED    Cocaine 08/09/2022 NONE DETECTED    Benzodiazepines 08/09/2022 POSITIVE (A)    Amphetamines 08/09/2022 NONE DETECTED    Tetrahydrocannabinol 08/09/2022 NONE DETECTED    Barbiturates 08/09/2022 NONE DETECTED    Sodium 08/10/2022 135    Potassium 08/10/2022 3.2 (L)    Chloride 08/10/2022 95 (L)    CO2 08/10/2022 26    Glucose, Bld 08/10/2022 91    BUN 08/10/2022 6    Creatinine, Ser 08/10/2022 0.81    Calcium 08/10/2022 9.1    Total Protein 08/10/2022 6.5    Albumin 08/10/2022 3.1 (L)    AST 08/10/2022 40    ALT 08/10/2022 48 (H)    Alkaline Phosphatase 08/10/2022 78    Total Bilirubin 08/10/2022 2.4 (H)    GFR, Estimated 08/10/2022 >60    Anion gap 08/10/2022 14    WBC 08/10/2022 11.5 (H)    RBC 08/10/2022 4.76    Hemoglobin 08/10/2022 16.7    HCT 08/10/2022 47.8    MCV 08/10/2022 100.4 (H)    MCH 08/10/2022 35.1 (H)    MCHC 08/10/2022 34.9    RDW 08/10/2022 12.5    Platelets 08/10/2022 149 (L)    nRBC 08/10/2022  0.0    Prothrombin Time 08/10/2022 13.7    INR 08/10/2022 1.1    Sodium 08/11/2022 132 (L)    Potassium 08/11/2022 3.0 (L)    Chloride 08/11/2022 96 (L)    CO2 08/11/2022 25    Glucose, Bld 08/11/2022 82    BUN 08/11/2022 9    Creatinine, Ser 08/11/2022 0.91    Calcium 08/11/2022 8.5 (L)    GFR, Estimated 08/11/2022 >60    Anion gap 08/11/2022 11    WBC 08/11/2022 14.5 (H)    RBC 08/11/2022 4.61    Hemoglobin 08/11/2022 16.0    HCT 08/11/2022 46.4    MCV 08/11/2022 100.7 (H)    MCH 08/11/2022 34.7 (H)    MCHC 08/11/2022 34.5    RDW 08/11/2022 12.6    Platelets 08/11/2022 144 (L)    nRBC 08/11/2022 0.0    Magnesium 08/11/2022 1.4 (L)    Procalcitonin 08/11/2022 0.10    WBC 08/12/2022 8.8    RBC 08/12/2022 4.95    Hemoglobin 08/12/2022 17.3 (H)    HCT 08/12/2022 49.6    MCV 08/12/2022 100.2 (H)    MCH 08/12/2022 34.9 (H)    MCHC 08/12/2022 34.9    RDW 08/12/2022 12.6    Platelets 08/12/2022 202    nRBC 08/12/2022 0.0    Sodium 08/12/2022 135    Potassium 08/12/2022 3.1 (L)    Chloride 08/12/2022 100    CO2 08/12/2022 21 (L)    Glucose, Bld 08/12/2022 97  BUN 08/12/2022 10    Creatinine, Ser 08/12/2022 1.04    Calcium 08/12/2022 8.8 (L)    Total Protein 08/12/2022 7.0    Albumin 08/12/2022 3.1 (L)    AST 08/12/2022 33    ALT 08/12/2022 35    Alkaline Phosphatase 08/12/2022 73    Total Bilirubin 08/12/2022 1.7 (H)    GFR, Estimated 08/12/2022 >60    Anion gap 08/12/2022 14    Magnesium 08/12/2022 1.6 (L)    No image results found.    Result Date: 05/20/2023 CLINICAL DATA:  Acute nonlocalized abdominal pain, right upper quadrant/epigastric abdominal pain EXAM: CT ABDOMEN AND PELVIS WITH CONTRAST TECHNIQUE: Multidetector CT imaging of the abdomen and pelvis was performed using the standard protocol following bolus administration of intravenous contrast. RADIATION DOSE REDUCTION: This exam was performed according to the departmental dose-optimization program which  includes automated exposure control, adjustment of the mA and/or kV according to patient size and/or use of iterative reconstruction technique. CONTRAST:  OMNIPAQUE IOHEXOL 300 MG/ML  SOLN COMPARISON:  08/08/2022 FINDINGS: Lower chest: No acute abnormality. Hepatobiliary: No focal liver abnormality is seen. No gallstones, gallbladder wall thickening, or biliary dilatation. Pancreas: There is normal enhancement of the pancreatic parenchyma. The pancreatic duct is not dilated. There is, however, subtle peripancreatic inflammatory stranding surrounding the head of the pancreas extending into the adjacent mesenteric root, best seen on axial image # 23/2 and there is shotty peripancreatic and periportal adenopathy, best seen on coronal image # 58/5, which together may reflect changes of mild acute pancreatitis. No loculated peripancreatic fluid collections are identified. Spleen: Unremarkable Adrenals/Urinary Tract: Adrenal glands are unremarkable. Kidneys are normal, without renal calculi, focal lesion, or hydronephrosis. Bladder is unremarkable. Stomach/Bowel: There is circumferential thickening and mild surrounding mesenteric inflammatory stranding involving the terminal ileum just proximal to the ileocecal junction best seen on axial image # 58/2 and coronal image # 62/5 in keeping with an infectious or inflammatory terminal ileitis. There is no evidence of obstruction or perforation. No free intraperitoneal gas or fluid. The stomach, small bowel, and large bowel are otherwise unremarkable. Appendix normal. Vascular/Lymphatic: The abdominal vasculature is unremarkable. No frankly pathologic adenopathy within the abdomen and pelvis. Reproductive: Prostate is unremarkable. Other: No abdominal wall hernia Musculoskeletal: No acute bone abnormality. No lytic or blastic bone lesion. IMPRESSION: 1. Circumferential thickening and mild surrounding mesenteric inflammatory stranding involving the terminal ileum just  proximal to the ileocecal junction in keeping with an infectious or inflammatory terminal ileitis. No evidence of obstruction or perforation. 2. Subtle peripancreatic inflammatory stranding surrounding the head of the pancreas extending into the adjacent mesenteric root with shotty peripancreatic and periportal adenopathy, which together may reflect changes of mild acute pancreatitis. Correlation with serum lipase may be helpful for further evaluation.     Additional Info: This encounter employed real-time, collaborative documentation. The patient actively reviewed and updated their medical record on a shared screen, ensuring transparency and facilitating joint problem-solving for the problem list, overview, and plan. This approach promotes accurate, informed care. The treatment plan was discussed and reviewed in detail, including medication safety, potential side effects, and all patient questions. We confirmed understanding and comfort with the plan. Follow-up instructions were established, including contacting the office for any concerns, returning if symptoms worsen, persist, or new symptoms develop, and precautions for potential emergency department visits.

## 2023-05-26 NOTE — Assessment & Plan Note (Signed)
Continue(s) with plan for weekly monitoring

## 2023-05-26 NOTE — Assessment & Plan Note (Addendum)
Liver tests continue to improve with bilirubin down to 2.1, ALT 956, AST 280. H. pylori negative. Additional testing shows elevated inflammatory markers (A1AT 192, ceruloplasmin 40.9) suggesting inflammatory response. Continue weekly monitoring. Celiac screen negative. Plan reviewed in detail in Patient Message.   Despite improvements we will continue to try to expedite gastrointestinal ref and I'm sharing this note with referral coordinator to assist, due to this coexisted with  terminal ileitis suspicious for crohn's so I'd like a sooner colonoscopy.  Likely due to ashwagandha supplement use, his liver function tests show improvement following the supplement's discontinuation, with no signs of advanced liver failure observed. We will continue monitoring liver function tests weekly until he normalizes and order a comprehensive lab workup for persistent fatigue.

## 2023-05-27 ENCOUNTER — Encounter: Payer: Self-pay | Admitting: Internal Medicine

## 2023-05-27 DIAGNOSIS — R5383 Other fatigue: Secondary | ICD-10-CM | POA: Insufficient documentation

## 2023-05-27 DIAGNOSIS — E785 Hyperlipidemia, unspecified: Secondary | ICD-10-CM | POA: Insufficient documentation

## 2023-05-27 DIAGNOSIS — Z9189 Other specified personal risk factors, not elsewhere classified: Secondary | ICD-10-CM | POA: Insufficient documentation

## 2023-05-27 LAB — IRON,TIBC AND FERRITIN PANEL
%SAT: 46 % (ref 20–48)
Ferritin: 758 ng/mL — ABNORMAL HIGH (ref 38–380)
Iron: 150 ug/dL (ref 50–180)
TIBC: 328 ug/dL (ref 250–425)

## 2023-05-27 LAB — LIPID PANEL W/REFLEX DIRECT LDL
Cholesterol: 179 mg/dL (ref <200)
HDL: 34 mg/dL — ABNORMAL LOW (ref >=40)
LDL Cholesterol (Calc): 111 mg/dL — ABNORMAL HIGH
Non-HDL Cholesterol (Calc): 145 mg/dL — ABNORMAL HIGH (ref <130)
Total CHOL/HDL Ratio: 5.3 (calc) — ABNORMAL HIGH (ref <5.0)
Triglycerides: 225 mg/dL — ABNORMAL HIGH (ref <150)

## 2023-05-27 LAB — TSH RFX ON ABNORMAL TO FREE T4: TSH: 1.2 u[IU]/mL (ref 0.450–4.500)

## 2023-05-27 LAB — LEPTOSPIRA AB SCREEN

## 2023-05-27 NOTE — Progress Notes (Signed)
Reviewed comprehensive labs (05/26/2023): Liver function improving with ALT 909, AST 321, bilirubin 2.1. Fatigue workup shows normal thyroid, B12, folate, and cortisol. Elevated inflammatory markers (ESR 33) likely related to recovering hepatitis and terminal ileitis. New finding of dyslipidemia noted. Pancreatic enzymes improving but not normalized. Plan: Continue weekly monitoring, maintain GI follow-up 11/6, lifestyle modifications discussed. Detailed recommendations in Patient Message.

## 2023-06-02 ENCOUNTER — Encounter: Payer: Self-pay | Admitting: Physician Assistant

## 2023-06-02 ENCOUNTER — Ambulatory Visit: Payer: 59 | Admitting: Physician Assistant

## 2023-06-02 VITALS — BP 110/74 | HR 84 | Ht 71.0 in | Wt 192.1 lb

## 2023-06-02 DIAGNOSIS — R748 Abnormal levels of other serum enzymes: Secondary | ICD-10-CM | POA: Diagnosis not present

## 2023-06-02 NOTE — Patient Instructions (Signed)
Your provider has requested that you go to the basement level for lab work in 2 weeks (around 06/16/23). Press "B" on the elevator. The lab is located at the first door on the left as you exit the elevator.  _______________________________________________________  If your blood pressure at your visit was 140/90 or greater, please contact your primary care physician to follow up on this.  _______________________________________________________  If you are age 39 or older, your body mass index should be between 23-30. Your Body mass index is 26.8 kg/m. If this is out of the aforementioned range listed, please consider follow up with your Primary Care Provider.  If you are age 63 or younger, your body mass index should be between 19-25. Your Body mass index is 26.8 kg/m. If this is out of the aformentioned range listed, please consider follow up with your Primary Care Provider.   ________________________________________________________  The [facility] providers would like to encourage you to use [facility] to communicate with providers for non-urgent requests or questions.  Due to long hold times on the telephone, sending your provider a message by [facility] may be a faster and more efficient way to get a response.  Please allow 48 business hours for a response.  Please remember that this is for non-urgent requests.  _______________________________________________________

## 2023-06-02 NOTE — Progress Notes (Signed)
Chief Complaint: Follow-up ER visit for abdominal pain  HPI:    Mr. Hoopes is a 37 year old male with a past medical history as listed below including alcohol dependence, GERD and multiple others, who was referred to me by Lula Olszewski, MD for a complaint of abdominal pain and elevated LFTs.      05/20/2023-05/23/2023 admission to the hospital for abdominal pain.  Found to have elevated LFTs.  This was thought related to [employer] (with actual Lenell Antu) which she has started 2 months prior.  LFTs gradually downtrended.  CT showed suggestion of possible pancreatitis and colitis.  This was thought secondary to medications he was taking.    05/21/2023 total bilirubin 4.3, alk phos 220, AST 677, ALT 1270.    05/25/2023 total bilirubin 2.1, alk phos 198, AST 280, ALT 956.    05/26/2023 total bilirubin 2.1, alk phos 210, AST 321, ALT 909.     06/01/2023 total bilirubin 0.88, alk phos 164, AST 58, ALT 283.  INR 1.0, PT 11.4.  CBC with a WBC of 13.1.  Platelets 451.  Lipase 98.    06/01/2023 [facility].  They recommended that he discontinue Celebrex as well.  Discussed it can take 6 months for liver enzymes to return to normal after an acute liver injury.    Today, patient tells me he continues to do well.  He is encouraged by his labs yesterday which do show a great decline in his liver function tests.  He denies any new complaints or concerns.  Remains healthy.    Denies fever, chills or weight loss.  Past Medical History:  Diagnosis Date   Abdominal pain with vomiting 08/09/2022   Alcohol dependence (HCC)    Cellulitis of left ankle 03/15/2020   GERD (gastroesophageal reflux disease) 08/28/2022   History of gastritis hospitalization   Hypertension    Marijuana abuse 08/09/2022   Obese 08/28/2022   Pain in left foot 03/05/2020    No past surgical history on file.  Current Outpatient Medications  Medication Sig Dispense Refill   ondansetron (ZOFRAN-ODT) 4 MG  disintegrating tablet Take 1 tablet (4 mg total) by mouth every 4 (four) hours as needed for nausea or vomiting. 90 tablet 2   No current facility-administered medications for this visit.    Allergies as of 06/02/2023   (No Known Allergies)    Family History  Problem Relation Age of Onset   Alcohol abuse Mother    Early death Father    Diabetes Father    Depression Father    Alcohol abuse Father    Heart attack Father    Alcohol abuse Paternal Uncle    Diabetes Maternal Grandmother    Diabetes Maternal Grandfather    Stroke Paternal Grandmother    Diabetes Paternal Grandmother    Arthritis Paternal Grandmother    Asthma Paternal Grandmother    Stroke Paternal Grandfather    Heart attack Paternal Grandfather    Arthritis Paternal Grandfather    Diabetes Paternal Grandfather     Social History   Socioeconomic History   Marital status: Single    Spouse name: Not on file   Number of children: Not on file   Years of education: Not on file   Highest education level: 12th grade  Occupational History   Occupation: logistics  Tobacco Use   Smoking status: Every Day    Current packs/day: 2.00    Average packs/day: 2.0 packs/day for 23.0 years (46.0 ttl pk-yrs)  Types: Cigarettes   Smokeless tobacco: Not on file  Vaping Use   Vaping status: Former  Substance and Sexual Activity   Alcohol use: Not Currently    Comment: Multiple Cocktails Daily   Drug use: Not Currently    Types: Marijuana    Comment: Occ   Sexual activity: Yes  Other Topics Concern   Not on file  Social History Narrative   Not on file   Social Determinants of Health   Financial Resource Strain: Low Risk  (05/13/2023)   Overall Financial Resource Strain (CARDIA)    Difficulty of Paying Living Expenses: Not hard at all  Food Insecurity: Low Risk  (06/01/2023)   Received from [facility]   Hunger Vital Sign    Worried About Running Out of Food in the Last Year: Never true    Ran Out of Food in  the Last Year: Never true  Transportation Needs: No Transportation Needs (06/01/2023)   Received from [facility]    In the past 12 months, has lack of reliable transportation kept you from medical appointments, meetings, work or from getting things needed for daily living? : No  Physical Activity: Sufficiently Active (05/13/2023)   Exercise Vital Sign    Days of Exercise per Week: 7 days    Minutes of Exercise per Session: 80 min  Stress: Stress Concern Present (05/13/2023)   [employer] of Occupational Health - Occupational Stress Questionnaire    Feeling of Stress : To some extent  Social Connections: Socially Isolated (05/13/2023)   Social Connection and Isolation Panel [NHANES]    Frequency of Communication with Friends and Family: Once a week    Frequency of Social Gatherings with Friends and Family: Once a week    Attends Religious Services: Never    Database administrator or Organizations: No    Attends Engineer, structural: Not on file    Marital Status: Married  Catering manager Violence: Not At Risk (05/21/2023)   Humiliation, Afraid, Rape, and Kick questionnaire    Fear of Current or Ex-Partner: No    Emotionally Abused: No    Physically Abused: No    Sexually Abused: No    Review of Systems:    Constitutional: No weight loss, fever or chills Cardiovascular: No chest pain  Respiratory: No SOB  Gastrointestinal: See HPI and otherwise negative   Physical Exam:  Vital signs: BP 110/74   Pulse 84   Ht 5\' 11"  (1.803 m)   Wt 192 lb 2 oz (87.1 kg)   BMI 26.80 kg/m    Constitutional:   Pleasant Caucasian male appears to be in NAD, Well developed, Well nourished, alert and cooperative Respiratory: Respirations even and unlabored. Lungs clear to auscultation bilaterally.   No wheezes, crackles, or rhonchi.  Cardiovascular: Normal S1, S2. No MRG. Regular rate and rhythm. No peripheral edema, cyanosis or pallor.  Gastrointestinal:  Soft,  nondistended, nontender. No rebound or guarding. Normal bowel sounds. No appreciable masses or hepatomegaly. Psychiatric: Oriented to person, place and time. Demonstrates good judgement and reason without abnormal affect or behaviors.  RELEVANT LABS AND IMAGING (and see HPI). CBC    Component Value Date/Time   WBC 7.3 05/26/2023 0914   RBC 5.06 05/26/2023 0914   HGB 15.0 05/26/2023 0914   HCT 46.0 05/26/2023 0914   PLT 361.0 05/26/2023 0914   PLT 249 05/14/2023 1207   MCV 90.9 05/26/2023 0914   MCH 29.8 05/23/2023 0821   MCHC 32.6  05/26/2023 0914   RDW 13.4 05/26/2023 0914   LYMPHSABS 1.6 05/26/2023 0914   MONOABS 0.8 05/26/2023 0914   EOSABS 0.2 05/26/2023 0914   BASOSABS 0.1 05/26/2023 0914    CMP     Component Value Date/Time   NA 140 05/26/2023 0914   K 5.1 No hemolysis seen 05/26/2023 0914   CL 101 05/26/2023 0914   CO2 31 05/26/2023 0914   GLUCOSE 92 05/26/2023 0914   BUN 10 05/26/2023 0914   CREATININE 0.84 05/26/2023 0914   CALCIUM 10.3 05/26/2023 0914   PROT 7.3 05/26/2023 0914   ALBUMIN 4.2 05/26/2023 0914   AST 321 (H) 05/26/2023 0914   ALT 909 (H) 05/26/2023 0914   ALKPHOS 210 (H) 05/26/2023 0914   BILITOT 2.1 (H) 05/26/2023 0914   GFRNONAA >60 05/23/2023 0821      Latest Ref Rng & Units 05/26/2023    9:14 AM 05/25/2023   11:22 AM 05/23/2023    8:21 AM  Hepatic Function  Total Protein 6.0 - 8.3 g/dL 7.3  7.9  7.3   Albumin 3.5 - 5.2 g/dL 4.2  4.4  3.4   AST 0 - 37 U/L 321  280  598   ALT 0 - 53 U/L 909  956  1,262   Alk Phosphatase 39 - 117 U/L 210  198  198   Total Bilirubin 0.2 - 1.2 mg/dL 2.1  2.1  2.9   Bilirubin, Direct 0.0 - 0.3 mg/dL  1.2      Assessment: 1.  Elevated LFTs: Hospitalized recently for elevation in LFTs, workup negative, thought related to a hair supplement he was taking at the time and Celebrex, he has discontinued both of these and [facility], LFTs continue to downtrend, feeling  well  Plan: 1.  Again do not to restart Nutrafol or Celebrex. 2.  We will recheck LFTs in 2 weeks. 3.  Patient to follow in clinic with [place] as needed.  Hyacinth Meeker, PA-C [facility] 06/02/2023, 9:46 AM  Cc: Lula Olszewski, MD

## 2023-06-02 NOTE — Telephone Encounter (Signed)
Patient's review of lab results/notes confirmed. Labs were ordered by another provider and completed yesterday.

## 2023-06-02 NOTE — Progress Notes (Signed)
Agree with assessment and plan as outlined.  Glad to hear that his liver enzymes have significantly improved.  Agree that this is very likely DILI.

## 2023-06-16 ENCOUNTER — Other Ambulatory Visit (INDEPENDENT_AMBULATORY_CARE_PROVIDER_SITE_OTHER): Payer: 59

## 2023-06-16 DIAGNOSIS — R748 Abnormal levels of other serum enzymes: Secondary | ICD-10-CM

## 2023-06-16 LAB — HEPATIC FUNCTION PANEL
ALT: 19 U/L (ref 0–53)
AST: 16 U/L (ref 0–37)
Albumin: 4.5 g/dL (ref 3.5–5.2)
Alkaline Phosphatase: 81 U/L (ref 39–117)
Bilirubin, Direct: 0.3 mg/dL (ref 0.0–0.3)
Total Bilirubin: 0.9 mg/dL (ref 0.2–1.2)
Total Protein: 7.7 g/dL (ref 6.0–8.3)

## 2023-08-19 ENCOUNTER — Ambulatory Visit: Payer: 59 | Admitting: Gastroenterology

## 2023-08-20 ENCOUNTER — Ambulatory Visit: Payer: 59 | Admitting: Physician Assistant

## 2023-08-30 ENCOUNTER — Encounter: Payer: Self-pay | Admitting: Internal Medicine

## 2023-08-30 ENCOUNTER — Ambulatory Visit (INDEPENDENT_AMBULATORY_CARE_PROVIDER_SITE_OTHER): Payer: 59 | Admitting: Internal Medicine

## 2023-08-30 VITALS — BP 135/82 | HR 91 | Temp 98.1°F | Ht 71.0 in | Wt 190.6 lb

## 2023-08-30 DIAGNOSIS — F17210 Nicotine dependence, cigarettes, uncomplicated: Secondary | ICD-10-CM | POA: Diagnosis not present

## 2023-08-30 DIAGNOSIS — Z13220 Encounter for screening for lipoid disorders: Secondary | ICD-10-CM

## 2023-08-30 DIAGNOSIS — K029 Dental caries, unspecified: Secondary | ICD-10-CM

## 2023-08-30 DIAGNOSIS — Z0001 Encounter for general adult medical examination with abnormal findings: Secondary | ICD-10-CM

## 2023-08-30 DIAGNOSIS — F17200 Nicotine dependence, unspecified, uncomplicated: Secondary | ICD-10-CM

## 2023-08-30 DIAGNOSIS — M545 Low back pain, unspecified: Secondary | ICD-10-CM | POA: Diagnosis not present

## 2023-08-30 DIAGNOSIS — R6889 Other general symptoms and signs: Secondary | ICD-10-CM

## 2023-08-30 DIAGNOSIS — G8929 Other chronic pain: Secondary | ICD-10-CM

## 2023-08-30 MED ORDER — BUPROPION HCL ER (SR) 100 MG PO TB12: 100.0000 mg | ORAL_TABLET | Freq: Every day | ORAL | 3 refills | Status: DC

## 2023-08-30 MED ORDER — CELECOXIB 100 MG PO CAPS: 100.0000 mg | ORAL_CAPSULE | Freq: Two times a day (BID) | ORAL | 2 refills | Status: DC

## 2023-08-30 NOTE — Assessment & Plan Note (Signed)
Medication Management Not currently taking any medications but plans to resume Wellbutrin and celecoxib. 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 Celecoxib was not the cause of liver failure. Resume Wellbutrin and celecoxib, and monitor liver function as needed.

## 2023-08-30 NOTE — Patient Instructions (Addendum)
It was a pleasure seeing you today! Your health and satisfaction are our top priorities.  Glenetta Hew, MD  Your Providers PCP: Lula Olszewski, MD,  [phone]) Referring Provider: Lula Olszewski, MD,  [phone])     NEXT STEPS: [x]  Early Intervention: Schedule sooner appointment, call our on-call services, or go to emergency room if there is any significant Increase in pain or discomfort New or worsening symptoms Sudden or severe changes in your health [x]  Flexible Follow-Up: We recommend a No follow-ups on file. for optimal routine care. This allows for progress monitoring and treatment adjustments. [x]  Preventive Care: Schedule your annual preventive care visit! It's typically covered by insurance and helps identify potential health issues early. [x]  Lab & X-ray Appointments: Incomplete tests scheduled today, or call to schedule. X-rays: [facility] (M-F, 8:30am-noon or 1pm-5pm). [x]  Medical Information Release: Sign a release form at front desk to obtain relevant medical information we don't have.  MAKING THE MOST OF OUR FOCUSED 20 MINUTE APPOINTMENTS: [x]   Clearly state your top concerns at the beginning of the visit to focus our discussion [x]   If you anticipate you will need more time, please inform the front desk during scheduling - we can book multiple appointments in the same week. [x]   If you have transportation problems- use our convenient video appointments or ask about transportation support. [x]   We can get down to business faster if you use MyChart to update information before the visit and submit non-urgent questions before your visit. Thank you for taking the time to provide details through MyChart.  Let our nurse know and she can import this information into your encounter documents.  Arrival and Wait Times: [x]   Arriving on time ensures that everyone receives prompt attention. [x]   Early morning (8a) and afternoon (1p) appointments tend to  have shortest wait times. [x]   Unfortunately, we cannot delay appointments for late arrivals or hold slots during phone calls.  Getting Answers and Following Up [x]   Simple Questions & Concerns: For quick questions or basic follow-up after your visit, reach [place] at [phone] or MyChart messaging. [x]   Complex Concerns: If your concern is more complex, scheduling an appointment might be best. Discuss this with the staff to find the most suitable option. [x]   Lab & Imaging Results: We'll contact you directly if results are abnormal or you don't use MyChart. Most normal results will be on MyChart within 2-3 business days, with a review message from Dr. Jon Billings. Haven't heard back in 2 weeks? Need results sooner? Contact us at [phone]. [x]   Referrals: Our referral coordinator will manage specialist referrals. The specialist's office should contact you within 2 weeks to schedule an appointment. Call us if you haven't heard from them after 2 weeks.  Staying Connected [x]   MyChart: Activate your MyChart for the fastest way to access results and message [place]. See the last page of this paperwork for instructions on how to activate.  Bring to Your Next Appointment [x]   Medications: Please bring all your medication bottles to your next appointment to ensure we have an accurate record of your prescriptions. [x]   Health Diaries: If you're monitoring any health conditions at home, keeping a diary of your readings can be very helpful for discussions at your next appointment.  Billing [x]   X-ray & Lab Orders: These are billed by separate companies. Contact the invoicing company directly for questions or concerns. [x]   Visit Charges: Discuss any billing inquiries with our administrative services  team.  Your Satisfaction Matters [x]   Share Your Experience: We strive for your satisfaction! If you have any complaints, or preferably compliments, please let Dr. Jon Billings know directly or contact our Practice  Administrators, Edwena Felty or [employer], by asking at the front desk.   Reviewing Your Records [x]   Review this early draft of your clinical encounter notes below and the final encounter summary tomorrow on MyChart after its been completed.  All orders placed so far are visible here: Encounter for annual general medical examination with abnormal findings in adult -     CT CHEST LUNG CANCER SCREENING LOW DOSE WO CONTRAST; Future -     Lipid panel -     Comprehensive metabolic panel -     CBC with Differential/Platelet -     TSH Rfx on Abnormal to Free T4  Smoking greater than 20 pack years -     CT CHEST LUNG CANCER SCREENING LOW DOSE WO CONTRAST; Future  Tobacco dependence -     buPROPion HCl ER (SR); Take 1 tablet (100 mg total) by mouth daily.  Dispense: 90 tablet; Refill: 3  Chronic low back pain, unspecified back pain laterality, unspecified whether sciatica present -     Celecoxib; Take 1 capsule (100 mg total) by mouth 2 (two) times daily.  Dispense: 60 capsule; Refill: 2  Flu-like symptoms  Dental caries    VISIT SUMMARY:  You came in today for symptoms that suggest the flu and for your annual physical exam. You have been feeling unwell since Sunday with chest congestion and a productive cough. You have a history of smoking and are currently tapering off. You also have significant dental issues and have not had a physical exam since you were a teenager. We discussed your symptoms, smoking cessation, medication management, dental care, and general health maintenance.  YOUR PLAN:  -SUSPECTED INFLUENZA: You have symptoms that suggest the flu, including chest congestion, a productive cough, and fever. We have ordered a flu swab test. If the test is negative, we recommend over-the-counter medications and rest. If the test is positive, we will discuss treatment options and potential costs.  -TOBACCO USE DISORDER: You have a significant history of smoking two packs a day for  23 years and are currently tapering off. Smoking increases the risk of lung cancer and other health issues. We have ordered a low-dose CT scan for lung cancer screening and will attempt to get insurance approval. If denied, we will discuss out-of-pocket payment options.  -MEDICATION MANAGEMENT: You plan to resume taking Wellbutrin and celecoxib. You previously stopped all medications due to liver issues in 2024, which you attribute to ashwagandha. Celecoxib was not the cause of liver failure. We will monitor your liver function as needed while you resume these medications.  -DENTAL CARIES: You have significant dental issues, including a destroyed top right molar and a large cavity in the second molar on the lower left. We strongly encourage you to obtain dental insurance and save for necessary procedures to prevent further deterioration.  -GENERAL HEALTH MAINTENANCE: You have not had a physical exam since you were a teenager. It is important to have regular eye and dental exams, sinus care, adequate sleep, and a healthy diet. We recommend scheduling vaccines for a later date due to your suspected flu. We also encourage you to avoid high-risk behaviors and to schedule yearly physical exams. Full lab work has been ordered as part of your physical exam.  INSTRUCTIONS:  Please schedule a follow-up  visit for your lung cancer screening results and ensure your yearly physical exam is scheduled. Additionally, upload any pictures of skin spots to the patient portal.

## 2023-08-30 NOTE — Progress Notes (Signed)
[facility]: [phone]   -- Annual Preventive Medical Office Visit --  Patient:  Stephen Reid      Age: 38 y.o.       Sex:  male  Date:   08/30/2023 Patient Care Team: Lula Olszewski, MD as PCP - General (Internal Medicine) Today's Healthcare Provider: Lula Olszewski, MD  ========================================= Chief Complaint  Patient presents with   Annual Exam   Purpose of Visit: Comprehensive preventive health assessment and personalized health maintenance planning.  This encounter was conducted as a Comprehensive Physical Exam (CPE) preventive care annual visit. The patient's medical history and problem list were reviewed to inform individualized preventive care recommendations.   No problem-specific medical treatment was provided during this visit.    Assessment & Plan Encounter for annual general medical examination with abnormal findings in adult  Has not had a physical since teenage years. Discussed the importance of regular eye and dental exams, sinus care, adequate sleep, and a healthy diet. Advised against high-risk behaviors. Vaccines cannot be administered today due to suspected flu but recommended scheduling for a later date. Encourage regular eye and dental exams, recommend sinus care to prevent respiratory infections, advise 8 hours of restful sleep per night, and encourage a diet rich in fruits, vegetables, and fiber. Discourage high-risk behaviors such as motorcycle riding and substance abuse. Order full lab work as part of the physical exam and schedule yearly physical exams. Smoking greater than 20 pack years Tobacco Use Disorder There is a significant smoking history of two packs per day for 23 years. Currently tapering off smoking but not ready for cessation support. Discussed risks of continued smoking, including lung cancer, and potential insurance denial for lung cancer screening due to age. A low-dose CT scan for  lung cancer screening is ordered. Attempt to get insurance approval; if denied, discuss out-of-pocket payment options. Tobacco dependence Medication Management Not currently taking any medications but plans to resume Wellbutrin and celecoxib. 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 Celecoxib was not the cause of liver failure. Resume Wellbutrin and celecoxib, and monitor liver function as needed. Chronic low back pain, unspecified back pain laterality, unspecified whether sciatica present Medication Management Not currently taking any medications but plans to resume Wellbutrin and celecoxib. 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 Celecoxib was not the cause of liver failure. Resume Wellbutrin and celecoxib, and monitor liver function as needed. Flu-like symptoms He came for Comprehensive Physical Exam (CPE) preventive care annual visit, and had negative COVID, I encouraged patient to let me flu screen but that was negative so he will self care, will not count as a treated problem. Symptoms suggest influenza, with chest congestion, productive cough, and fever since Sunday. A COVID-19 test was negative. Differential diagnosis includes RSV and other viral respiratory infections.  Dental caries Dental Caries Significant dental issues include a destroyed top right molar and a large cavity in the second molar on the lower left. No dental insurance and has not been to the dentist due to cost concerns. Strongly encouraged obtaining dental insurance and saving for necessary procedures to prevent further deterioration. Diagnoses and all orders for this visit: Smoking greater than 20 pack years Encounter for annual general medical examination with abnormal findings in adult   Today's Health Maintenance Counseling and Anticipatory Guidance:  Eye exams:  every 1-2 years Dental cleanings: every 6 months or  more, brush/floss  3x daily Sinus Care: saline spray rinses daily Sleep: 8 hr nightly, good sleep hygiene, If unrestful, use e-monitoring Diet:  fruits/vegetables/fiber/healthy fats, balance and moderation Exercise:  150 minutes/weekly Discouraged any/all high risk behaviors  Reviewed/updated/encouraged completion:  There is no immunization history on file for this patient. Health Maintenance Due  Topic Date Due   Pneumococcal Vaccine 67-62 Years old (1 of 2 - PCV) Never done   DTaP/Tdap/Td (1 - Tdap) Never done   Health Maintenance  Topic Date Due   Pneumococcal Vaccine 75-86 Years old (1 of 2 - PCV) Never done   DTaP/Tdap/Td (1 - Tdap) Never done   INFLUENZA VACCINE  10/25/2023 (Originally 02/25/2023)   HIV Screening  Completed   HPV VACCINES  Aged Out   COVID-19 Vaccine  Discontinued   Hepatitis C Screening  Discontinued   Social History   Substance and Sexual Activity  Sexual Activity Yes  .   Social History   Tobacco Use   Smoking status: Every Day    Current packs/day: 2.00    Average packs/day: 2.0 packs/day for 23.0 years (46.0 ttl pk-yrs)    Types: Cigarettes  Vaping Use   Vaping status: Former  Substance Use Topics   Alcohol use: Not Currently    Comment: Multiple Cocktails Daily   Drug use: Not Currently    Types: Marijuana    Comment: Occ      08/30/2023    2:28 PM  Depression screen PHQ 2/9  Decreased Interest 0  Down, Depressed, Hopeless 0  PHQ - 2 Score 0  Altered sleeping 1  Tired, decreased energy 1  Change in appetite 0  Feeling bad or failure about yourself  0  Trouble concentrating 0  Moving slowly or fidgety/restless 0  Suicidal thoughts 0  PHQ-9 Score 2  Difficult doing work/chores Not difficult at all    Today's Cancer Screening Shared Decision Making Discussions: Penile/Testicle/Scrotum: encouraged self-monitoring and reporting of genital abnormalities. Patient reports there are none. Thyroid:  checked and advised to check by  palpating thyroid for nodules Prostate:  individualized risks/benefits/costs discussed (No results found for: "PSA") we elected not to check yet via shared decision making  Colon:   Denies strong family history of colon cancer or blood in stool so no screening is indicated until age 57.   Lung:  Current guidelines recommend Individuals aged 41 to 66 who currently smoke or formerly smoked and have a >= 20 pack-year smoking history should undergo annual screening with low-dose computed tomography (LDCT). [place]vere we will try for him due to very heavy smoking history... but insurance is likely to challenge. Skin: Advised regular sunscreen use. He denies worrisome, changing, or new skin lesions. Other: discussed lack of screening guidelines and insurance coverage for other types.     Subjective  38 y.o. male presents today for a complete physical exam.  History of Present Illness The patient is a 38 year old male who presents with symptoms suggestive of the flu and for an annual physical exam.  He has been feeling unwell since Sunday, with symptoms primarily in his chest, including coughing up phlegm. He notes feeling somewhat better today. He has been tested for COVID, which was negative, and is awaiting a flu swab. He feels feverish and sweats but denies having a fever on the machine.  He has a history of smoking two packs a day for twenty-three years and wants to taper off smoking. He recently quit drinking. He is not currently taking any medications  but plans to resume Wellbutrin and celecoxib for back pain. 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  He has not had a physical exam since he was a teenager and is seeking a comprehensive evaluation. He reports no dental or eye exams recently and acknowledges significant dental issues, including a destroyed top right molar and a cavity in the second molar on the lower left. He does not have dental  insurance.  He reports sleeping eight hours a night and consuming a diet rich in fruits, vegetables, and fiber. No lumps or bumps in the testicular region, blood in his semen, or blood in his stool. No skin spots of concern and no current medication use. He is not currently using sunscreen as he no longer works outdoors.   ROS he has flulike symptom(s) but he does not desire any treatment(s) for this, it was incidental finding at this annual exam visit.  Disclaimer about ROS at Annual Preventive Visits Patients are informed before the Review of Systems (ROS) that identifying significant medical issues during the wellness visit may require immediate attention, potentially resulting in a separate billable encounter beyond the scope of the preventive exam. This disclosure is mandated by professional ethics and legal obligations, as healthcare providers must address any substantial health concerns raised during any patient interaction.  A comprehensive ROS is required by insurance companies for billing the visit. However, this structure may inadvertently discourage patients from fully disclosing health concerns due to potential financial implications. Consequently, patients often emphasize that any positive ROS findings are related to stable chronic conditions, requesting that these not be discussed during the preventive visit to avoid additional charges. Patients may also ask that reported complaints not be listed in the ROS to prevent affecting billing.  I attest that I have reviewed and confirmed the patients current medications to meet the medication reconciliation requirement he is not taking anything anymore Current Outpatient Medications on File Prior to Visit  Medication Sig   buPROPion ER (WELLBUTRIN SR) 100 MG 12 hr tablet Take 100 mg by mouth daily. (Patient not taking: Reported on 08/30/2023)   ondansetron (ZOFRAN-ODT) 4 MG disintegrating tablet Take 1 tablet (4 mg total) by mouth every 4 (four)  hours as needed for nausea or vomiting. (Patient not taking: Reported on 06/02/2023)   No current facility-administered medications on file prior to visit.  There are no discontinued medications.The following were reviewed and/or entered/updated into our electronic MEDICAL RECORD NUMBERPast Medical History:  Diagnosis Date   Abdominal pain with vomiting 08/09/2022   Alcohol dependence (HCC)    Cellulitis of left ankle 03/15/2020   Elevated liver enzymes    GERD (gastroesophageal reflux disease) 08/28/2022   History of gastritis hospitalization   Hypertension    Marijuana abuse 08/09/2022   Obese 08/28/2022   Pain in left foot 03/05/2020  History reviewed. No pertinent surgical history. Social History   Socioeconomic History   Marital status: Single    Spouse name: Not on file   Number of children: Not on file   Years of education: Not on file   Highest education level: 12th grade  Occupational History   Occupation: logistics  Tobacco Use   Smoking status: Every Day    Current packs/day: 2.00    Average packs/day: 2.0 packs/day for 23.0 years (46.0 ttl pk-yrs)    Types: Cigarettes   Smokeless tobacco: Not on file  Vaping Use   Vaping status: Former  Substance and Sexual Activity  Alcohol use: Not Currently    Comment: Multiple Cocktails Daily   Drug use: Not Currently    Types: Marijuana    Comment: Occ   Sexual activity: Yes  Other Topics Concern   Not on file  Social History Narrative   Not on file   Social Drivers of Health   Financial Resource Strain: Low Risk  (05/13/2023)   Overall Financial Resource Strain (CARDIA)    Difficulty of Paying Living Expenses: Not hard at all  Food Insecurity: Low Risk  (06/01/2023)   Received from [facility]   Hunger Vital Sign    Worried About Running Out of Food in the Last Year: Never true    Ran Out of Food in the Last Year: Never true  Transportation Needs: No Transportation Needs (06/01/2023)   Received from [employer]    In the past 12 months, has lack of reliable transportation kept you from medical appointments, meetings, work or from getting things needed for daily living? : No  Physical Activity: Sufficiently Active (05/13/2023)   Exercise Vital Sign    Days of Exercise per Week: 7 days    Minutes of Exercise per Session: 80 min  Stress: Stress Concern Present (05/13/2023)   [employer] of Occupational Health - Occupational Stress Questionnaire    Feeling of Stress : To some extent  Social Connections: Socially Isolated (05/13/2023)   Social Connection and Isolation Panel [NHANES]    Frequency of Communication with Friends and Family: Once a week    Frequency of Social Gatherings with Friends and Family: Once a week    Attends Religious Services: Never    Database administrator or Organizations: No    Attends Engineer, structural: Not on file    Marital Status: Married  Catering manager Violence: Not At Risk (05/21/2023)   Humiliation, Afraid, Rape, and Kick questionnaire    Fear of Current or Ex-Partner: No    Emotionally Abused: No    Physically Abused: No    Sexually Abused: No      05/13/2023    7:21 AM  Alcohol Use Disorder Test (AUDIT)  1. How often do you have a drink containing alcohol? 0  3. How often do you have six or more drinks on one occasion? 0   Family History  Problem Relation Age of Onset   Alcohol abuse Mother    Early death Father    Diabetes Father    Depression Father    Alcohol abuse Father    Heart attack Father    Alcohol abuse Paternal Uncle    Diabetes Maternal Grandmother    Diabetes Maternal Grandfather    Stroke Paternal Grandmother    Diabetes Paternal Grandmother    Arthritis Paternal Grandmother    Asthma Paternal Grandmother    Stroke Paternal Grandfather    Heart attack Paternal Grandfather    Arthritis Paternal Grandfather    Diabetes Paternal Grandfather   No Known Allergies    Objective  BP 135/82    Pulse 91   Temp 98.1 F (36.7 C) (Temporal)   Ht 5\' 11"  (1.803 m)   Wt 190 lb 9.6 oz (86.5 kg)   SpO2 96%   BMI 26.58 kg/m Physical ExamBody mass index is 26.58 kg/m. BP Readings from Last 3 Encounters:  08/30/23 135/82  06/02/23 110/74  05/26/23 123/78   Wt Readings from Last 3 Encounters:  08/30/23 190 lb 9.6 oz (86.5 kg)  06/02/23 192 lb  2 oz (87.1 kg)  05/26/23 197 lb (89.4 kg)    GEN: No acute distress, resting comfortably. HEENT: Tympanic membranes normal appearing bilaterally, oropharynx clear, no thyromegaly noted, no palpable lymphadenopathy or thyroid nodules.Ears normal. Throat normal. Teeth stained; top right molar destroyed; second molar on bottom left side destroyed; second molar on bottom left side has a large cavity. CARDIOVASCULAR: S1 and S2 heart sounds with regular rate and rhythm, no murmurs appreciated. hyperdynamic PULMONARY: Normal work of breathing, clear to auscultation bilaterally, no crackles, wheezes, or rhonchi. ABDOMEN: Soft, nontender, nondistended. MSK: No edema, cyanosis, or clubbing noted. SKIN: Warm, dry, no lesions of concern observed. NEUROLOGICAL: Cranial nerves II-XII grossly intact, strength 5/5 in upper and lower extremities, reflexes symmetric and intact bilaterally. PSYCH: Normal affect and thought content, pleasant and cooperative.  Results Reviewed: Last depression screening scores    08/30/2023    2:28 PM 05/26/2023    8:35 AM 05/14/2023   11:40 AM  PHQ 2/9 Scores  PHQ - 2 Score 0 0 0  PHQ- 9 Score 2 2 3    Last fall risk screening    08/30/2023    2:28 PM  Fall Risk   Falls in the past year? 0  Number falls in past yr: 0  Injury with Fall? 0  Risk for fall due to : No Fall Risks  Follow up Falls evaluation completed    Lula Olszewski, MD  [facility] [phone] (phone) [phone] (fax)

## 2023-08-31 LAB — CBC WITH DIFFERENTIAL/PLATELET
Basophils Absolute: 0.1 10*3/uL (ref 0.0–0.1)
Basophils Relative: 1.1 % (ref 0.0–3.0)
Eosinophils Absolute: 0.1 10*3/uL (ref 0.0–0.7)
Eosinophils Relative: 2 % (ref 0.0–5.0)
HCT: 46.7 % (ref 39.0–52.0)
Hemoglobin: 15.7 g/dL (ref 13.0–17.0)
Lymphocytes Relative: 27.4 % (ref 12.0–46.0)
Lymphs Abs: 1.9 10*3/uL (ref 0.7–4.0)
MCHC: 33.5 g/dL (ref 30.0–36.0)
MCV: 89.4 fL (ref 78.0–100.0)
Monocytes Absolute: 1.1 10*3/uL — ABNORMAL HIGH (ref 0.1–1.0)
Monocytes Relative: 16 % — ABNORMAL HIGH (ref 3.0–12.0)
Neutro Abs: 3.6 10*3/uL (ref 1.4–7.7)
Neutrophils Relative %: 53.5 % (ref 43.0–77.0)
Platelets: 245 10*3/uL (ref 150.0–400.0)
RBC: 5.23 Mil/uL (ref 4.22–5.81)
RDW: 13 % (ref 11.5–15.5)
WBC: 6.8 10*3/uL (ref 4.0–10.5)

## 2023-08-31 LAB — COMPREHENSIVE METABOLIC PANEL
ALT: 25 U/L (ref 0–53)
AST: 23 U/L (ref 0–37)
Albumin: 4.3 g/dL (ref 3.5–5.2)
Alkaline Phosphatase: 71 U/L (ref 39–117)
BUN: 6 mg/dL (ref 6–23)
CO2: 28 meq/L (ref 19–32)
Calcium: 9.3 mg/dL (ref 8.4–10.5)
Chloride: 101 meq/L (ref 96–112)
Creatinine, Ser: 0.95 mg/dL (ref 0.40–1.50)
GFR: 100.74 mL/min (ref >60.00)
Glucose, Bld: 78 mg/dL (ref 70–99)
Potassium: 4.1 meq/L (ref 3.5–5.1)
Sodium: 138 meq/L (ref 135–145)
Total Bilirubin: 0.4 mg/dL (ref 0.2–1.2)
Total Protein: 7.7 g/dL (ref 6.0–8.3)

## 2023-08-31 LAB — LIPID PANEL
Cholesterol: 140 mg/dL (ref 0–200)
HDL: 48.6 mg/dL (ref >39.00)
LDL Cholesterol: 50 mg/dL (ref 0–99)
NonHDL: 90.98
Total CHOL/HDL Ratio: 3
Triglycerides: 203 mg/dL — ABNORMAL HIGH (ref 0.0–149.0)
VLDL: 40.6 mg/dL — ABNORMAL HIGH (ref 0.0–40.0)

## 2023-08-31 LAB — POCT INFLUENZA A/B
Influenza A, POC: NEGATIVE
Influenza B, POC: NEGATIVE

## 2023-08-31 LAB — TSH RFX ON ABNORMAL TO FREE T4: TSH: 0.55 u[IU]/mL (ref 0.450–4.500)

## 2023-08-31 NOTE — Addendum Note (Signed)
Addended by: Donzetta Starch on: 08/31/2023 04:33 PM   Modules accepted: Orders

## 2023-09-01 ENCOUNTER — Telehealth: Payer: Self-pay

## 2023-09-01 NOTE — Telephone Encounter (Signed)
 Copied from CRM [id number]. Topic: Clinical - Request for Lab/Test Order >> Aug 31, 2023  4:16 PM Isabell A wrote: Reason for CRM: Pansy from [facility] calling in regard to Low Dose CT Chest w/o Contrast for Lung Cancer Screening test, he is to young to have this test - patient does need to be 50+, requesting a different order to be sent.   Callback number: [phone]  Please see message from GI and pace needed orders for patient

## 2023-09-02 ENCOUNTER — Encounter: Payer: Self-pay | Admitting: Internal Medicine

## 2023-09-02 DIAGNOSIS — Z8719 Personal history of other diseases of the digestive system: Secondary | ICD-10-CM | POA: Insufficient documentation

## 2023-09-07 NOTE — Telephone Encounter (Signed)
Left vm for patient to call the office back or check my chart for lab results/notes. Mailing lab results/notes to patient.

## 2023-09-08 NOTE — Telephone Encounter (Signed)
Left vm for patient to call the office back or respond to my chart message that I sent concerning this imaging.

## 2023-09-13 NOTE — Telephone Encounter (Signed)
Copied from CRM [id number]. Topic: Clinical - Medication Question >> Sep 13, 2023  2:12 PM Sonny Dandy B wrote: Reason for CRM: [facility] called to request an updated order for CT chest without contrast. Please update order in epic. And contact [place] Dom [phone]  option,1 and option 4     Called back and spoke with [place] from [facility]. Informed her that I am waiting to hear back from the patient to know if he wants to do the CT imaging or the referral with his pulmonologist Ritta Slot.  Donzetta Starch, CMA

## 2023-09-28 NOTE — Telephone Encounter (Signed)
 Patient saw the message that I sent concerning this on 2/12 asking patient to let us know what he would like to do. He has not responded to this message.

## 2024-05-11 ENCOUNTER — Ambulatory Visit: Payer: Self-pay | Admitting: Internal Medicine

## 2024-05-11 LAB — HSV DNA BY PCR (REFERENCE LAB)
HSV 1 DNA: NEGATIVE
HSV 2 DNA: NEGATIVE

## 2024-05-20 ENCOUNTER — Other Ambulatory Visit: Payer: Self-pay | Admitting: Internal Medicine

## 2024-05-20 DIAGNOSIS — F17200 Nicotine dependence, unspecified, uncomplicated: Secondary | ICD-10-CM

## 2024-05-20 DIAGNOSIS — G8929 Other chronic pain: Secondary | ICD-10-CM
